# Patient Record
Sex: Female | Born: 1940 | Race: White | Hispanic: No | Marital: Married | Smoking: Never smoker
Health system: Southern US, Community
[De-identification: ages and names within clinical notes are randomized; demographics above are authoritative.]

## PROBLEM LIST (undated history)

## (undated) HISTORY — PX: CATARACT EXTRACTION: SUR2

## (undated) HISTORY — PX: OTHER SURGICAL HISTORY: SHX169

---

## 2012-02-07 DIAGNOSIS — L723 Sebaceous cyst: Secondary | ICD-10-CM | POA: Diagnosis not present

## 2012-02-08 DIAGNOSIS — L570 Actinic keratosis: Secondary | ICD-10-CM | POA: Diagnosis not present

## 2012-02-21 DIAGNOSIS — H04129 Dry eye syndrome of unspecified lacrimal gland: Secondary | ICD-10-CM | POA: Diagnosis not present

## 2012-02-21 DIAGNOSIS — H2510 Age-related nuclear cataract, unspecified eye: Secondary | ICD-10-CM | POA: Diagnosis not present

## 2012-02-21 DIAGNOSIS — H02409 Unspecified ptosis of unspecified eyelid: Secondary | ICD-10-CM | POA: Diagnosis not present

## 2012-11-11 DIAGNOSIS — E785 Hyperlipidemia, unspecified: Secondary | ICD-10-CM | POA: Diagnosis not present

## 2012-11-11 DIAGNOSIS — I8390 Asymptomatic varicose veins of unspecified lower extremity: Secondary | ICD-10-CM | POA: Diagnosis not present

## 2012-11-12 DIAGNOSIS — E785 Hyperlipidemia, unspecified: Secondary | ICD-10-CM | POA: Diagnosis not present

## 2012-11-12 DIAGNOSIS — Z1231 Encounter for screening mammogram for malignant neoplasm of breast: Secondary | ICD-10-CM | POA: Diagnosis not present

## 2012-11-12 DIAGNOSIS — G589 Mononeuropathy, unspecified: Secondary | ICD-10-CM | POA: Diagnosis not present

## 2012-11-14 DIAGNOSIS — R42 Dizziness and giddiness: Secondary | ICD-10-CM | POA: Diagnosis not present

## 2012-11-14 DIAGNOSIS — E039 Hypothyroidism, unspecified: Secondary | ICD-10-CM | POA: Diagnosis not present

## 2013-02-03 DIAGNOSIS — E039 Hypothyroidism, unspecified: Secondary | ICD-10-CM | POA: Diagnosis not present

## 2013-02-06 DIAGNOSIS — E039 Hypothyroidism, unspecified: Secondary | ICD-10-CM | POA: Diagnosis not present

## 2013-09-25 DIAGNOSIS — I6529 Occlusion and stenosis of unspecified carotid artery: Secondary | ICD-10-CM | POA: Diagnosis not present

## 2013-09-25 DIAGNOSIS — N390 Urinary tract infection, site not specified: Secondary | ICD-10-CM | POA: Diagnosis not present

## 2013-09-25 DIAGNOSIS — I658 Occlusion and stenosis of other precerebral arteries: Secondary | ICD-10-CM | POA: Diagnosis not present

## 2013-09-25 DIAGNOSIS — R404 Transient alteration of awareness: Secondary | ICD-10-CM | POA: Diagnosis not present

## 2013-09-25 DIAGNOSIS — I517 Cardiomegaly: Secondary | ICD-10-CM | POA: Diagnosis not present

## 2013-09-25 DIAGNOSIS — I951 Orthostatic hypotension: Secondary | ICD-10-CM | POA: Diagnosis not present

## 2013-09-25 DIAGNOSIS — R61 Generalized hyperhidrosis: Secondary | ICD-10-CM | POA: Diagnosis not present

## 2013-09-25 DIAGNOSIS — R55 Syncope and collapse: Secondary | ICD-10-CM | POA: Diagnosis not present

## 2013-09-25 DIAGNOSIS — E039 Hypothyroidism, unspecified: Secondary | ICD-10-CM | POA: Diagnosis not present

## 2013-09-25 DIAGNOSIS — I959 Hypotension, unspecified: Secondary | ICD-10-CM | POA: Diagnosis not present

## 2013-09-25 DIAGNOSIS — R9431 Abnormal electrocardiogram [ECG] [EKG]: Secondary | ICD-10-CM | POA: Diagnosis not present

## 2013-09-25 DIAGNOSIS — Z23 Encounter for immunization: Secondary | ICD-10-CM | POA: Diagnosis not present

## 2013-09-26 DIAGNOSIS — I369 Nonrheumatic tricuspid valve disorder, unspecified: Secondary | ICD-10-CM | POA: Diagnosis not present

## 2013-09-26 DIAGNOSIS — I059 Rheumatic mitral valve disease, unspecified: Secondary | ICD-10-CM | POA: Diagnosis not present

## 2013-09-26 DIAGNOSIS — R55 Syncope and collapse: Secondary | ICD-10-CM | POA: Diagnosis not present

## 2013-10-08 DIAGNOSIS — R012 Other cardiac sounds: Secondary | ICD-10-CM | POA: Diagnosis not present

## 2013-10-08 DIAGNOSIS — E039 Hypothyroidism, unspecified: Secondary | ICD-10-CM | POA: Diagnosis not present

## 2013-10-08 DIAGNOSIS — R42 Dizziness and giddiness: Secondary | ICD-10-CM | POA: Diagnosis not present

## 2013-10-23 ENCOUNTER — Ambulatory Visit: Payer: Self-pay | Admitting: Cardiology

## 2013-11-03 DIAGNOSIS — J069 Acute upper respiratory infection, unspecified: Secondary | ICD-10-CM | POA: Diagnosis not present

## 2013-11-03 DIAGNOSIS — R002 Palpitations: Secondary | ICD-10-CM | POA: Diagnosis not present

## 2013-11-05 DIAGNOSIS — H02409 Unspecified ptosis of unspecified eyelid: Secondary | ICD-10-CM | POA: Diagnosis not present

## 2013-11-05 DIAGNOSIS — H2510 Age-related nuclear cataract, unspecified eye: Secondary | ICD-10-CM | POA: Diagnosis not present

## 2013-11-05 DIAGNOSIS — H04129 Dry eye syndrome of unspecified lacrimal gland: Secondary | ICD-10-CM | POA: Diagnosis not present

## 2013-11-06 DIAGNOSIS — R002 Palpitations: Secondary | ICD-10-CM | POA: Diagnosis not present

## 2013-11-09 DIAGNOSIS — I10 Essential (primary) hypertension: Secondary | ICD-10-CM | POA: Diagnosis not present

## 2013-11-09 DIAGNOSIS — L570 Actinic keratosis: Secondary | ICD-10-CM | POA: Diagnosis not present

## 2013-11-09 DIAGNOSIS — L819 Disorder of pigmentation, unspecified: Secondary | ICD-10-CM | POA: Diagnosis not present

## 2013-11-10 DIAGNOSIS — R000 Tachycardia, unspecified: Secondary | ICD-10-CM | POA: Diagnosis not present

## 2013-11-13 DIAGNOSIS — R42 Dizziness and giddiness: Secondary | ICD-10-CM | POA: Diagnosis not present

## 2013-11-13 DIAGNOSIS — E039 Hypothyroidism, unspecified: Secondary | ICD-10-CM | POA: Diagnosis not present

## 2013-11-13 DIAGNOSIS — I4949 Other premature depolarization: Secondary | ICD-10-CM | POA: Diagnosis not present

## 2013-11-17 DIAGNOSIS — Z1231 Encounter for screening mammogram for malignant neoplasm of breast: Secondary | ICD-10-CM | POA: Diagnosis not present

## 2013-11-24 DIAGNOSIS — H02409 Unspecified ptosis of unspecified eyelid: Secondary | ICD-10-CM | POA: Diagnosis not present

## 2014-02-09 DIAGNOSIS — H02839 Dermatochalasis of unspecified eye, unspecified eyelid: Secondary | ICD-10-CM | POA: Diagnosis not present

## 2014-02-09 DIAGNOSIS — H5340 Unspecified visual field defects: Secondary | ICD-10-CM | POA: Diagnosis not present

## 2014-02-09 DIAGNOSIS — H02429 Myogenic ptosis of unspecified eyelid: Secondary | ICD-10-CM | POA: Diagnosis not present

## 2014-03-25 DIAGNOSIS — R059 Cough, unspecified: Secondary | ICD-10-CM | POA: Diagnosis not present

## 2014-03-25 DIAGNOSIS — R05 Cough: Secondary | ICD-10-CM | POA: Diagnosis not present

## 2014-03-25 DIAGNOSIS — E039 Hypothyroidism, unspecified: Secondary | ICD-10-CM | POA: Diagnosis not present

## 2014-04-13 DIAGNOSIS — Z01818 Encounter for other preprocedural examination: Secondary | ICD-10-CM | POA: Diagnosis not present

## 2014-04-13 DIAGNOSIS — H02409 Unspecified ptosis of unspecified eyelid: Secondary | ICD-10-CM | POA: Diagnosis not present

## 2014-04-22 DIAGNOSIS — H5340 Unspecified visual field defects: Secondary | ICD-10-CM | POA: Diagnosis not present

## 2014-04-22 DIAGNOSIS — H02409 Unspecified ptosis of unspecified eyelid: Secondary | ICD-10-CM | POA: Diagnosis not present

## 2014-04-22 DIAGNOSIS — Z85828 Personal history of other malignant neoplasm of skin: Secondary | ICD-10-CM | POA: Diagnosis not present

## 2014-04-22 DIAGNOSIS — E039 Hypothyroidism, unspecified: Secondary | ICD-10-CM | POA: Diagnosis not present

## 2014-04-22 DIAGNOSIS — H02429 Myogenic ptosis of unspecified eyelid: Secondary | ICD-10-CM | POA: Diagnosis not present

## 2014-08-05 DIAGNOSIS — Z23 Encounter for immunization: Secondary | ICD-10-CM | POA: Diagnosis not present

## 2014-11-09 DIAGNOSIS — E039 Hypothyroidism, unspecified: Secondary | ICD-10-CM | POA: Diagnosis not present

## 2014-11-09 DIAGNOSIS — E669 Obesity, unspecified: Secondary | ICD-10-CM | POA: Diagnosis not present

## 2014-11-09 DIAGNOSIS — Z23 Encounter for immunization: Secondary | ICD-10-CM | POA: Diagnosis not present

## 2014-11-09 DIAGNOSIS — Z713 Dietary counseling and surveillance: Secondary | ICD-10-CM | POA: Diagnosis not present

## 2014-11-09 DIAGNOSIS — Z6833 Body mass index (BMI) 33.0-33.9, adult: Secondary | ICD-10-CM | POA: Diagnosis not present

## 2014-12-15 ENCOUNTER — Other Ambulatory Visit: Payer: Self-pay

## 2014-12-15 DIAGNOSIS — Z1231 Encounter for screening mammogram for malignant neoplasm of breast: Secondary | ICD-10-CM

## 2014-12-29 ENCOUNTER — Ambulatory Visit
Admission: RE | Admit: 2014-12-29 | Discharge: 2014-12-29 | Disposition: A | Discharge status: Home / Self Care | Payer: Medicare Other | Source: Ambulatory Visit

## 2014-12-29 DIAGNOSIS — Z1231 Encounter for screening mammogram for malignant neoplasm of breast: Secondary | ICD-10-CM | POA: Diagnosis not present

## 2015-02-14 DIAGNOSIS — H2513 Age-related nuclear cataract, bilateral: Secondary | ICD-10-CM | POA: Diagnosis not present

## 2015-02-28 DIAGNOSIS — H25011 Cortical age-related cataract, right eye: Secondary | ICD-10-CM | POA: Diagnosis not present

## 2015-02-28 DIAGNOSIS — H2512 Age-related nuclear cataract, left eye: Secondary | ICD-10-CM | POA: Diagnosis not present

## 2015-02-28 DIAGNOSIS — H02411 Mechanical ptosis of right eyelid: Secondary | ICD-10-CM | POA: Diagnosis not present

## 2015-02-28 DIAGNOSIS — H2511 Age-related nuclear cataract, right eye: Secondary | ICD-10-CM | POA: Diagnosis not present

## 2015-03-10 DIAGNOSIS — E669 Obesity, unspecified: Secondary | ICD-10-CM | POA: Diagnosis not present

## 2015-03-10 DIAGNOSIS — Z0000 Encounter for general adult medical examination without abnormal findings: Secondary | ICD-10-CM | POA: Diagnosis not present

## 2015-03-10 DIAGNOSIS — Z131 Encounter for screening for diabetes mellitus: Secondary | ICD-10-CM | POA: Diagnosis not present

## 2015-03-10 DIAGNOSIS — Z136 Encounter for screening for cardiovascular disorders: Secondary | ICD-10-CM | POA: Diagnosis not present

## 2015-03-10 DIAGNOSIS — Z23 Encounter for immunization: Secondary | ICD-10-CM | POA: Diagnosis not present

## 2015-03-10 DIAGNOSIS — Z780 Asymptomatic menopausal state: Secondary | ICD-10-CM | POA: Diagnosis not present

## 2015-03-10 DIAGNOSIS — E039 Hypothyroidism, unspecified: Secondary | ICD-10-CM | POA: Diagnosis not present

## 2015-03-10 DIAGNOSIS — Z85828 Personal history of other malignant neoplasm of skin: Secondary | ICD-10-CM | POA: Diagnosis not present

## 2015-03-18 DIAGNOSIS — L570 Actinic keratosis: Secondary | ICD-10-CM | POA: Diagnosis not present

## 2015-03-18 DIAGNOSIS — X32XXXD Exposure to sunlight, subsequent encounter: Secondary | ICD-10-CM | POA: Diagnosis not present

## 2015-03-18 DIAGNOSIS — L309 Dermatitis, unspecified: Secondary | ICD-10-CM | POA: Diagnosis not present

## 2015-03-18 DIAGNOSIS — D225 Melanocytic nevi of trunk: Secondary | ICD-10-CM | POA: Diagnosis not present

## 2015-03-24 DIAGNOSIS — H2513 Age-related nuclear cataract, bilateral: Secondary | ICD-10-CM | POA: Diagnosis not present

## 2015-03-24 DIAGNOSIS — H2511 Age-related nuclear cataract, right eye: Secondary | ICD-10-CM | POA: Diagnosis not present

## 2015-03-24 DIAGNOSIS — H25811 Combined forms of age-related cataract, right eye: Secondary | ICD-10-CM | POA: Diagnosis not present

## 2015-03-25 DIAGNOSIS — H2512 Age-related nuclear cataract, left eye: Secondary | ICD-10-CM | POA: Diagnosis not present

## 2015-03-29 DIAGNOSIS — Z780 Asymptomatic menopausal state: Secondary | ICD-10-CM | POA: Diagnosis not present

## 2015-03-29 DIAGNOSIS — E2839 Other primary ovarian failure: Secondary | ICD-10-CM | POA: Diagnosis not present

## 2015-04-14 DIAGNOSIS — H2513 Age-related nuclear cataract, bilateral: Secondary | ICD-10-CM | POA: Diagnosis not present

## 2015-04-14 DIAGNOSIS — H2512 Age-related nuclear cataract, left eye: Secondary | ICD-10-CM | POA: Diagnosis not present

## 2015-04-14 DIAGNOSIS — H25812 Combined forms of age-related cataract, left eye: Secondary | ICD-10-CM | POA: Diagnosis not present

## 2015-08-30 DIAGNOSIS — L570 Actinic keratosis: Secondary | ICD-10-CM | POA: Diagnosis not present

## 2015-08-30 DIAGNOSIS — C44310 Basal cell carcinoma of skin of unspecified parts of face: Secondary | ICD-10-CM | POA: Diagnosis not present

## 2015-08-30 DIAGNOSIS — X32XXXD Exposure to sunlight, subsequent encounter: Secondary | ICD-10-CM | POA: Diagnosis not present

## 2015-10-11 DIAGNOSIS — X32XXXD Exposure to sunlight, subsequent encounter: Secondary | ICD-10-CM | POA: Diagnosis not present

## 2015-10-11 DIAGNOSIS — C4441 Basal cell carcinoma of skin of scalp and neck: Secondary | ICD-10-CM | POA: Diagnosis not present

## 2015-10-11 DIAGNOSIS — L570 Actinic keratosis: Secondary | ICD-10-CM | POA: Diagnosis not present

## 2015-10-11 DIAGNOSIS — Z85828 Personal history of other malignant neoplasm of skin: Secondary | ICD-10-CM | POA: Diagnosis not present

## 2015-10-11 DIAGNOSIS — Z08 Encounter for follow-up examination after completed treatment for malignant neoplasm: Secondary | ICD-10-CM | POA: Diagnosis not present

## 2016-03-14 DIAGNOSIS — E039 Hypothyroidism, unspecified: Secondary | ICD-10-CM | POA: Diagnosis not present

## 2016-03-14 DIAGNOSIS — Z0000 Encounter for general adult medical examination without abnormal findings: Secondary | ICD-10-CM | POA: Diagnosis not present

## 2016-03-14 DIAGNOSIS — Z131 Encounter for screening for diabetes mellitus: Secondary | ICD-10-CM | POA: Diagnosis not present

## 2016-03-14 DIAGNOSIS — E663 Overweight: Secondary | ICD-10-CM | POA: Diagnosis not present

## 2016-08-07 DIAGNOSIS — H16223 Keratoconjunctivitis sicca, not specified as Sjogren's, bilateral: Secondary | ICD-10-CM | POA: Diagnosis not present

## 2016-12-14 ENCOUNTER — Other Ambulatory Visit: Payer: Self-pay | Admitting: Family Medicine

## 2016-12-14 DIAGNOSIS — Z1231 Encounter for screening mammogram for malignant neoplasm of breast: Secondary | ICD-10-CM

## 2016-12-26 ENCOUNTER — Ambulatory Visit
Admission: RE | Admit: 2016-12-26 | Discharge: 2016-12-26 | Disposition: A | Discharge status: Home / Self Care | Payer: Medicare HMO | Source: Ambulatory Visit | Attending: Family Medicine | Admitting: Family Medicine

## 2016-12-26 DIAGNOSIS — Z1231 Encounter for screening mammogram for malignant neoplasm of breast: Secondary | ICD-10-CM | POA: Diagnosis not present

## 2017-01-18 DIAGNOSIS — R0789 Other chest pain: Secondary | ICD-10-CM | POA: Diagnosis not present

## 2017-03-19 DIAGNOSIS — E669 Obesity, unspecified: Secondary | ICD-10-CM | POA: Diagnosis not present

## 2017-03-19 DIAGNOSIS — Z6831 Body mass index (BMI) 31.0-31.9, adult: Secondary | ICD-10-CM | POA: Diagnosis not present

## 2017-03-19 DIAGNOSIS — E039 Hypothyroidism, unspecified: Secondary | ICD-10-CM | POA: Diagnosis not present

## 2017-03-19 DIAGNOSIS — Z131 Encounter for screening for diabetes mellitus: Secondary | ICD-10-CM | POA: Diagnosis not present

## 2017-03-19 DIAGNOSIS — Z0000 Encounter for general adult medical examination without abnormal findings: Secondary | ICD-10-CM | POA: Diagnosis not present

## 2017-03-19 DIAGNOSIS — Z85828 Personal history of other malignant neoplasm of skin: Secondary | ICD-10-CM | POA: Diagnosis not present

## 2017-03-19 DIAGNOSIS — Z1211 Encounter for screening for malignant neoplasm of colon: Secondary | ICD-10-CM | POA: Diagnosis not present

## 2017-03-25 DIAGNOSIS — Z1211 Encounter for screening for malignant neoplasm of colon: Secondary | ICD-10-CM | POA: Diagnosis not present

## 2017-11-04 DIAGNOSIS — H02403 Unspecified ptosis of bilateral eyelids: Secondary | ICD-10-CM | POA: Diagnosis not present

## 2017-11-07 DIAGNOSIS — Z23 Encounter for immunization: Secondary | ICD-10-CM | POA: Diagnosis not present

## 2017-12-10 DIAGNOSIS — H02423 Myogenic ptosis of bilateral eyelids: Secondary | ICD-10-CM | POA: Diagnosis not present

## 2017-12-16 DIAGNOSIS — H02423 Myogenic ptosis of bilateral eyelids: Secondary | ICD-10-CM | POA: Diagnosis not present

## 2018-01-01 DIAGNOSIS — H5789 Other specified disorders of eye and adnexa: Secondary | ICD-10-CM | POA: Diagnosis not present

## 2018-01-01 DIAGNOSIS — H02423 Myogenic ptosis of bilateral eyelids: Secondary | ICD-10-CM | POA: Diagnosis not present

## 2018-01-01 DIAGNOSIS — H02421 Myogenic ptosis of right eyelid: Secondary | ICD-10-CM | POA: Diagnosis not present

## 2018-01-01 DIAGNOSIS — H02422 Myogenic ptosis of left eyelid: Secondary | ICD-10-CM | POA: Diagnosis not present

## 2018-04-01 DIAGNOSIS — Z1389 Encounter for screening for other disorder: Secondary | ICD-10-CM | POA: Diagnosis not present

## 2018-04-01 DIAGNOSIS — Z85828 Personal history of other malignant neoplasm of skin: Secondary | ICD-10-CM | POA: Diagnosis not present

## 2018-04-01 DIAGNOSIS — Z1211 Encounter for screening for malignant neoplasm of colon: Secondary | ICD-10-CM | POA: Diagnosis not present

## 2018-04-01 DIAGNOSIS — R42 Dizziness and giddiness: Secondary | ICD-10-CM | POA: Diagnosis not present

## 2018-04-01 DIAGNOSIS — Z0000 Encounter for general adult medical examination without abnormal findings: Secondary | ICD-10-CM | POA: Diagnosis not present

## 2018-04-01 DIAGNOSIS — Z6831 Body mass index (BMI) 31.0-31.9, adult: Secondary | ICD-10-CM | POA: Diagnosis not present

## 2018-04-01 DIAGNOSIS — E039 Hypothyroidism, unspecified: Secondary | ICD-10-CM | POA: Diagnosis not present

## 2018-04-01 DIAGNOSIS — E669 Obesity, unspecified: Secondary | ICD-10-CM | POA: Diagnosis not present

## 2018-05-23 ENCOUNTER — Other Ambulatory Visit: Payer: Self-pay | Admitting: Family Medicine

## 2018-05-23 DIAGNOSIS — Z1231 Encounter for screening mammogram for malignant neoplasm of breast: Secondary | ICD-10-CM

## 2018-06-16 ENCOUNTER — Ambulatory Visit: Payer: Medicare HMO

## 2018-07-07 ENCOUNTER — Ambulatory Visit
Admission: RE | Admit: 2018-07-07 | Discharge: 2018-07-07 | Disposition: A | Discharge status: Home / Self Care | Payer: Medicare HMO | Source: Ambulatory Visit | Attending: Family Medicine | Admitting: Family Medicine

## 2018-07-07 DIAGNOSIS — Z1231 Encounter for screening mammogram for malignant neoplasm of breast: Secondary | ICD-10-CM | POA: Diagnosis not present

## 2018-10-01 DIAGNOSIS — Z23 Encounter for immunization: Secondary | ICD-10-CM | POA: Diagnosis not present

## 2018-10-21 DIAGNOSIS — H5212 Myopia, left eye: Secondary | ICD-10-CM | POA: Diagnosis not present

## 2018-10-21 DIAGNOSIS — Z0100 Encounter for examination of eyes and vision without abnormal findings: Secondary | ICD-10-CM | POA: Diagnosis not present

## 2019-05-05 DIAGNOSIS — Z0000 Encounter for general adult medical examination without abnormal findings: Secondary | ICD-10-CM | POA: Diagnosis not present

## 2019-05-05 DIAGNOSIS — Z136 Encounter for screening for cardiovascular disorders: Secondary | ICD-10-CM | POA: Diagnosis not present

## 2019-05-05 DIAGNOSIS — Z131 Encounter for screening for diabetes mellitus: Secondary | ICD-10-CM | POA: Diagnosis not present

## 2019-05-05 DIAGNOSIS — E039 Hypothyroidism, unspecified: Secondary | ICD-10-CM | POA: Diagnosis not present

## 2019-05-11 DIAGNOSIS — Z85828 Personal history of other malignant neoplasm of skin: Secondary | ICD-10-CM | POA: Diagnosis not present

## 2019-05-11 DIAGNOSIS — E669 Obesity, unspecified: Secondary | ICD-10-CM | POA: Diagnosis not present

## 2019-05-11 DIAGNOSIS — R899 Unspecified abnormal finding in specimens from other organs, systems and tissues: Secondary | ICD-10-CM | POA: Diagnosis not present

## 2019-05-11 DIAGNOSIS — E7800 Pure hypercholesterolemia, unspecified: Secondary | ICD-10-CM | POA: Diagnosis not present

## 2019-05-20 DIAGNOSIS — Z85828 Personal history of other malignant neoplasm of skin: Secondary | ICD-10-CM | POA: Diagnosis not present

## 2019-05-20 DIAGNOSIS — L570 Actinic keratosis: Secondary | ICD-10-CM | POA: Diagnosis not present

## 2019-05-20 DIAGNOSIS — X32XXXA Exposure to sunlight, initial encounter: Secondary | ICD-10-CM | POA: Diagnosis not present

## 2019-05-20 DIAGNOSIS — L308 Other specified dermatitis: Secondary | ICD-10-CM | POA: Diagnosis not present

## 2019-05-20 DIAGNOSIS — Z08 Encounter for follow-up examination after completed treatment for malignant neoplasm: Secondary | ICD-10-CM | POA: Diagnosis not present

## 2019-08-18 DIAGNOSIS — H04123 Dry eye syndrome of bilateral lacrimal glands: Secondary | ICD-10-CM | POA: Diagnosis not present

## 2019-09-17 DIAGNOSIS — H02423 Myogenic ptosis of bilateral eyelids: Secondary | ICD-10-CM | POA: Diagnosis not present

## 2019-09-17 DIAGNOSIS — H02834 Dermatochalasis of left upper eyelid: Secondary | ICD-10-CM | POA: Diagnosis not present

## 2019-09-17 DIAGNOSIS — H04129 Dry eye syndrome of unspecified lacrimal gland: Secondary | ICD-10-CM | POA: Diagnosis not present

## 2019-09-17 DIAGNOSIS — H02835 Dermatochalasis of left lower eyelid: Secondary | ICD-10-CM | POA: Diagnosis not present

## 2019-09-17 DIAGNOSIS — H53483 Generalized contraction of visual field, bilateral: Secondary | ICD-10-CM | POA: Diagnosis not present

## 2019-09-17 DIAGNOSIS — H02413 Mechanical ptosis of bilateral eyelids: Secondary | ICD-10-CM | POA: Diagnosis not present

## 2019-09-17 DIAGNOSIS — H02831 Dermatochalasis of right upper eyelid: Secondary | ICD-10-CM | POA: Diagnosis not present

## 2019-09-17 DIAGNOSIS — H02832 Dermatochalasis of right lower eyelid: Secondary | ICD-10-CM | POA: Diagnosis not present

## 2020-03-28 DIAGNOSIS — H5212 Myopia, left eye: Secondary | ICD-10-CM | POA: Diagnosis not present

## 2020-05-05 DIAGNOSIS — Z8639 Personal history of other endocrine, nutritional and metabolic disease: Secondary | ICD-10-CM | POA: Diagnosis not present

## 2020-05-05 DIAGNOSIS — Z0000 Encounter for general adult medical examination without abnormal findings: Secondary | ICD-10-CM | POA: Diagnosis not present

## 2020-05-05 DIAGNOSIS — Z6831 Body mass index (BMI) 31.0-31.9, adult: Secondary | ICD-10-CM | POA: Diagnosis not present

## 2020-05-05 DIAGNOSIS — E785 Hyperlipidemia, unspecified: Secondary | ICD-10-CM | POA: Diagnosis not present

## 2020-05-10 DIAGNOSIS — Z0000 Encounter for general adult medical examination without abnormal findings: Secondary | ICD-10-CM | POA: Diagnosis not present

## 2020-06-21 DIAGNOSIS — Z0100 Encounter for examination of eyes and vision without abnormal findings: Secondary | ICD-10-CM | POA: Diagnosis not present

## 2020-08-23 ENCOUNTER — Other Ambulatory Visit: Payer: Medicare HMO

## 2020-08-23 DIAGNOSIS — Z20822 Contact with and (suspected) exposure to covid-19: Secondary | ICD-10-CM

## 2020-08-25 LAB — SARS-COV-2, NAA 2 DAY TAT

## 2020-08-25 LAB — NOVEL CORONAVIRUS, NAA: SARS-CoV-2, NAA: NOT DETECTED

## 2021-01-04 DIAGNOSIS — H01111 Allergic dermatitis of right upper eyelid: Secondary | ICD-10-CM | POA: Diagnosis not present

## 2021-02-13 DIAGNOSIS — L249 Irritant contact dermatitis, unspecified cause: Secondary | ICD-10-CM | POA: Diagnosis not present

## 2021-02-21 DIAGNOSIS — D3121 Benign neoplasm of right retina: Secondary | ICD-10-CM | POA: Diagnosis not present

## 2021-02-21 DIAGNOSIS — H524 Presbyopia: Secondary | ICD-10-CM | POA: Diagnosis not present

## 2021-02-21 DIAGNOSIS — H5340 Unspecified visual field defects: Secondary | ICD-10-CM | POA: Diagnosis not present

## 2021-02-21 DIAGNOSIS — D3122 Benign neoplasm of left retina: Secondary | ICD-10-CM | POA: Diagnosis not present

## 2021-02-28 DIAGNOSIS — L258 Unspecified contact dermatitis due to other agents: Secondary | ICD-10-CM | POA: Diagnosis not present

## 2021-03-02 DIAGNOSIS — H5340 Unspecified visual field defects: Secondary | ICD-10-CM | POA: Diagnosis not present

## 2021-03-16 ENCOUNTER — Ambulatory Visit: Payer: Medicare HMO | Attending: Internal Medicine

## 2021-03-16 ENCOUNTER — Other Ambulatory Visit: Payer: Self-pay

## 2021-03-16 DIAGNOSIS — Z23 Encounter for immunization: Secondary | ICD-10-CM

## 2021-03-16 NOTE — Progress Notes (Signed)
   Covid-19 Vaccination Clinic  Name:  Summer Wood    MRN: [id number] DOB: [birth year]  03/16/2021  Summer Wood was observed post Covid-19 immunization for 15 minutes without incident. She was provided with Vaccine Information Sheet and instruction to access the V-Safe system.   Summer Wood was instructed to call 911 with any severe reactions post vaccine: Marland Kitchen Difficulty breathing  . Swelling of face and throat  . A fast heartbeat  . A bad rash all over body  . Dizziness and weakness   Immunizations Administered    Name Date Dose VIS Date Route   PFIZER Comrnaty(Gray TOP) Covid-19 Vaccine 03/16/2021  1:52 PM 0.3 mL 11/10/2020 Intramuscular   Manufacturer: [place]   Lot: [id number]   [place]: [phone]

## 2021-03-20 ENCOUNTER — Other Ambulatory Visit (HOSPITAL_BASED_OUTPATIENT_CLINIC_OR_DEPARTMENT_OTHER): Payer: Self-pay

## 2021-03-20 MED ORDER — COVID-19 MRNA VACCINE (PFIZER) 30 MCG/0.3ML IM SUSP
INTRAMUSCULAR | 0 refills | Status: DC
  Filled 2021-03-20: qty 0.3, 1d supply, fill #0

## 2021-05-08 DIAGNOSIS — Z0000 Encounter for general adult medical examination without abnormal findings: Secondary | ICD-10-CM | POA: Diagnosis not present

## 2021-05-08 DIAGNOSIS — Z1211 Encounter for screening for malignant neoplasm of colon: Secondary | ICD-10-CM | POA: Diagnosis not present

## 2021-05-08 DIAGNOSIS — Z1389 Encounter for screening for other disorder: Secondary | ICD-10-CM | POA: Diagnosis not present

## 2021-05-09 DIAGNOSIS — Z1211 Encounter for screening for malignant neoplasm of colon: Secondary | ICD-10-CM | POA: Diagnosis not present

## 2021-05-12 ENCOUNTER — Other Ambulatory Visit: Payer: Self-pay | Admitting: Family Medicine

## 2021-05-12 DIAGNOSIS — Z1231 Encounter for screening mammogram for malignant neoplasm of breast: Secondary | ICD-10-CM

## 2021-06-13 DIAGNOSIS — E785 Hyperlipidemia, unspecified: Secondary | ICD-10-CM | POA: Diagnosis not present

## 2021-06-13 DIAGNOSIS — E669 Obesity, unspecified: Secondary | ICD-10-CM | POA: Diagnosis not present

## 2021-06-13 DIAGNOSIS — Z6832 Body mass index (BMI) 32.0-32.9, adult: Secondary | ICD-10-CM | POA: Diagnosis not present

## 2021-06-13 DIAGNOSIS — Z8639 Personal history of other endocrine, nutritional and metabolic disease: Secondary | ICD-10-CM | POA: Diagnosis not present

## 2021-06-13 DIAGNOSIS — Z79899 Other long term (current) drug therapy: Secondary | ICD-10-CM | POA: Diagnosis not present

## 2021-06-27 ENCOUNTER — Other Ambulatory Visit: Payer: Self-pay | Admitting: Family Medicine

## 2021-06-27 DIAGNOSIS — E785 Hyperlipidemia, unspecified: Secondary | ICD-10-CM

## 2021-07-06 ENCOUNTER — Other Ambulatory Visit: Payer: Self-pay

## 2021-07-06 ENCOUNTER — Ambulatory Visit
Admission: RE | Admit: 2021-07-06 | Discharge: 2021-07-06 | Disposition: A | Discharge status: Home / Self Care | Payer: Medicare HMO | Source: Ambulatory Visit | Attending: Family Medicine | Admitting: Family Medicine

## 2021-07-06 DIAGNOSIS — Z1231 Encounter for screening mammogram for malignant neoplasm of breast: Secondary | ICD-10-CM | POA: Diagnosis not present

## 2021-07-18 ENCOUNTER — Other Ambulatory Visit: Payer: Self-pay

## 2021-07-18 ENCOUNTER — Ambulatory Visit
Admission: RE | Admit: 2021-07-18 | Discharge: 2021-07-18 | Disposition: A | Discharge status: Home / Self Care | Payer: No Typology Code available for payment source | Source: Ambulatory Visit | Attending: Family Medicine | Admitting: Family Medicine

## 2021-07-18 DIAGNOSIS — E785 Hyperlipidemia, unspecified: Secondary | ICD-10-CM

## 2021-07-21 ENCOUNTER — Other Ambulatory Visit: Payer: Medicare HMO

## 2021-10-03 DIAGNOSIS — Z23 Encounter for immunization: Secondary | ICD-10-CM | POA: Diagnosis not present

## 2021-10-03 DIAGNOSIS — R42 Dizziness and giddiness: Secondary | ICD-10-CM | POA: Diagnosis not present

## 2021-10-18 IMAGING — CT CT CARDIAC CORONARY ARTERY CALCIUM SCORE
3 series · 14 of 20 positions shown, 16 images · non-contrast
Comparison: None.

CLINICAL DATA: Hyperlipidemia

EXAM:
CT CARDIAC CORONARY ARTERY CALCIUM SCORE
TECHNIQUE: Non-contrast imaging through the heart was performed using
prospective ECG gating. Image post processing was performed on an
independent workstation, allowing for quantitative analysis of the
heart and coronary arteries. Note that this exam targets the heart
and the chest was not imaged in its entirety.

[Series 2: calcium scoring 2.00 qr36 bestdiast 70% hrt calciu · axial · 0.41mm/px · z∈[+1663,+1747]mm · 4 of 70 slices shown]
[im 14/70  vessel]
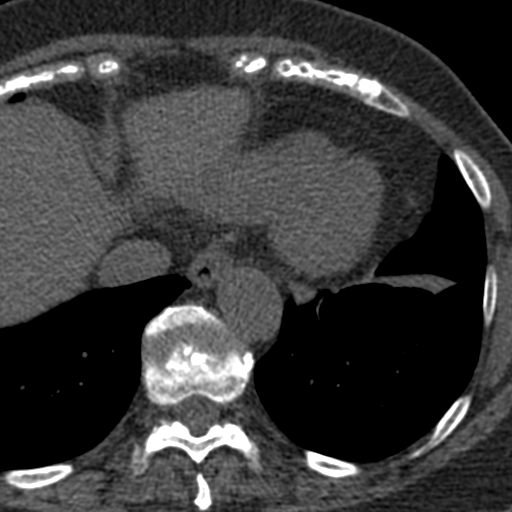
[im 28/70  vessel]
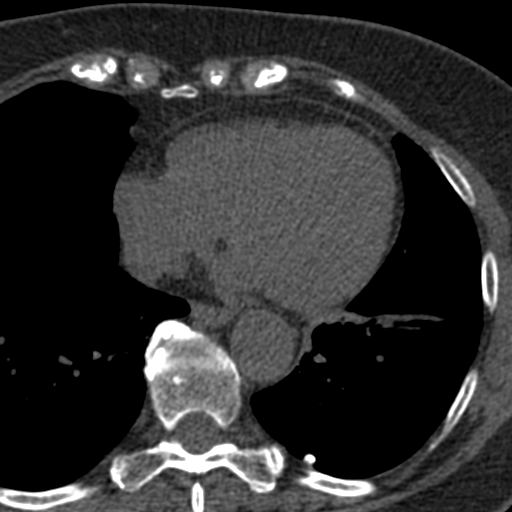
[im 42/70  vessel]
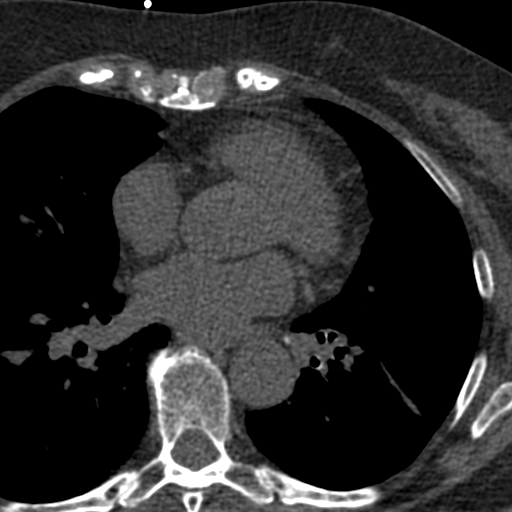
[im 56/70  vessel]
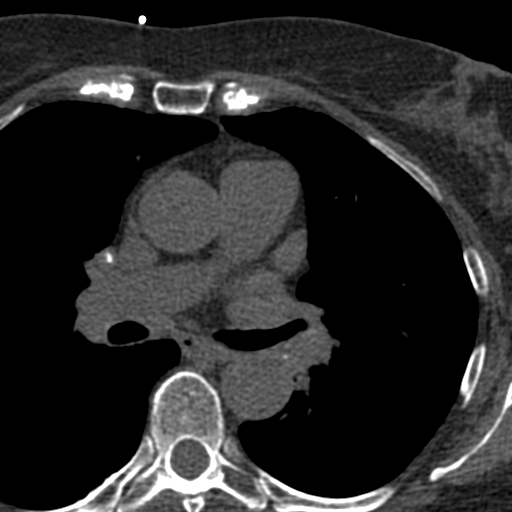

[Series 3: calcium scoring 2.00 br40 bestdiast 70% axial · axial · 0.54mm/px · z∈[+1659,+1751]mm · 5 of 70 slices shown, 7 images]
[im 12/70  vessel]
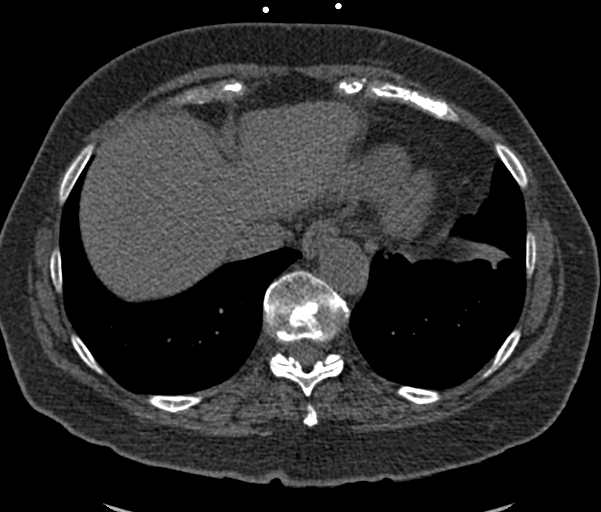
[im 12/70  lung]
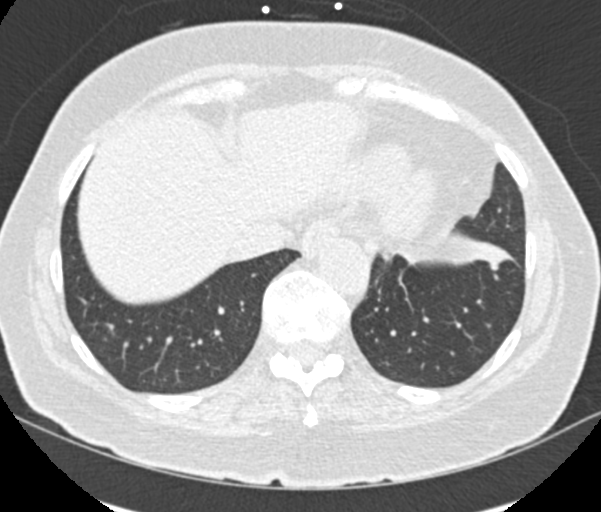
[im 24/70  vessel]
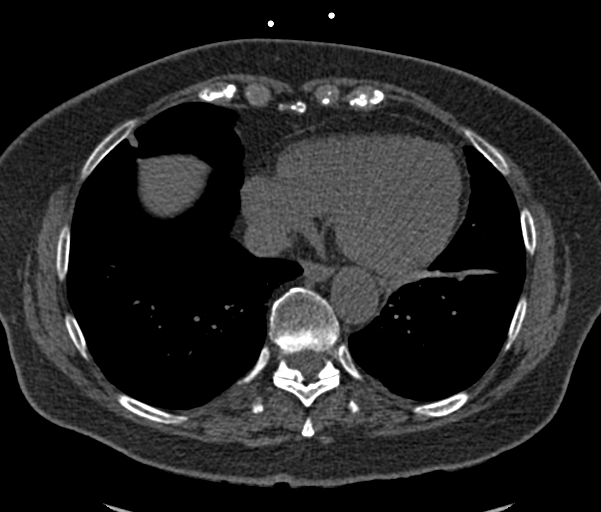
[im 35/70  vessel]
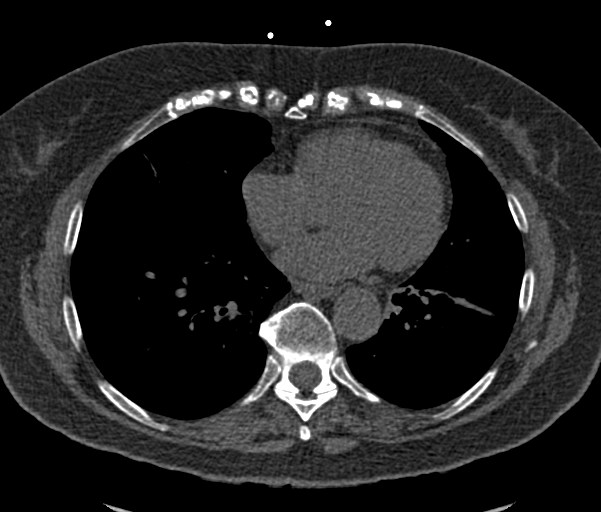
[im 47/70  vessel]
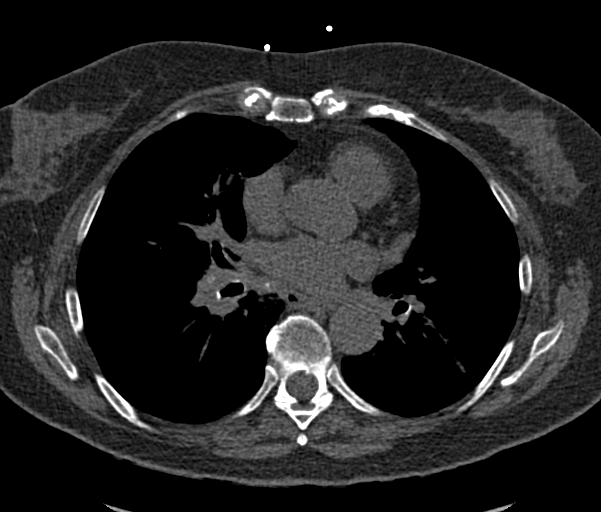
[im 58/70  vessel]
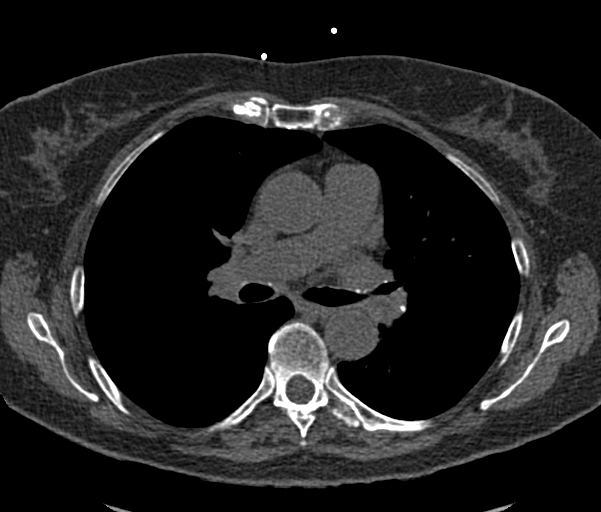
[im 58/70  lung]
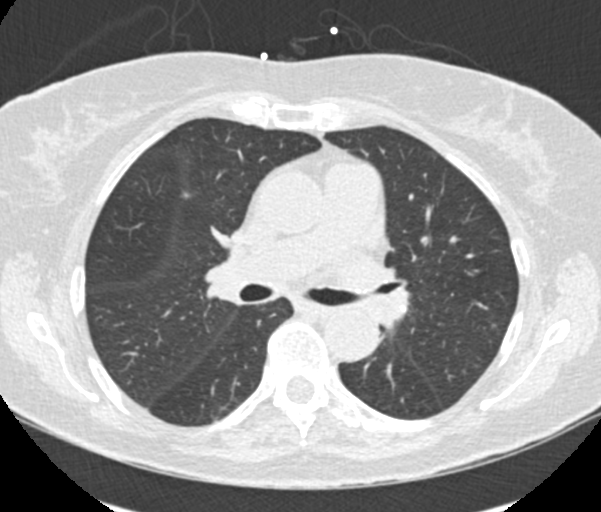

[Series 9: calcium scoring 2.00 br60 bestdiast 70% lungs · axial · 0.54mm/px · z∈[+1659,+1751]mm · 5 of 70 slices shown]
[im 12/70  vessel]
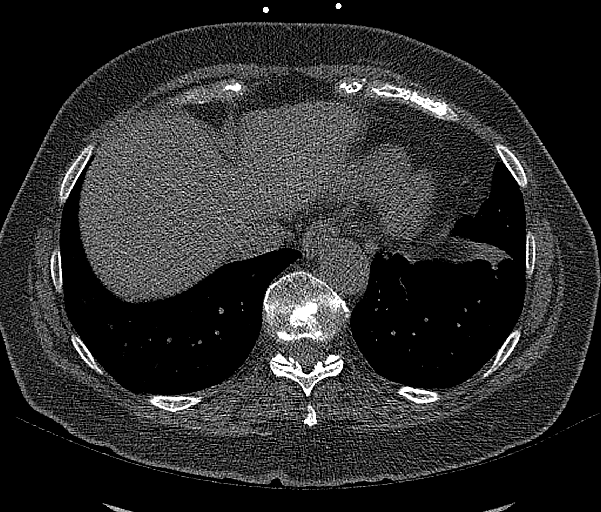
[im 24/70  vessel]
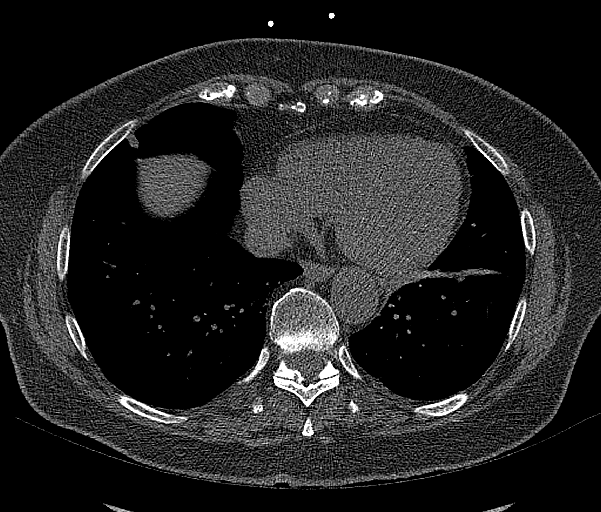
[im 35/70  vessel]
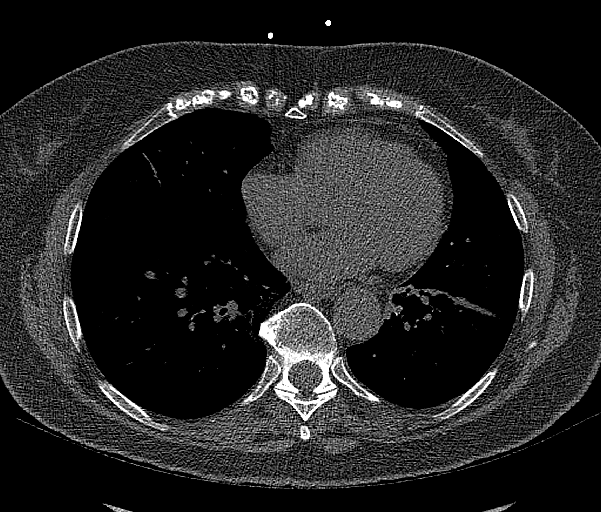
[im 47/70  vessel]
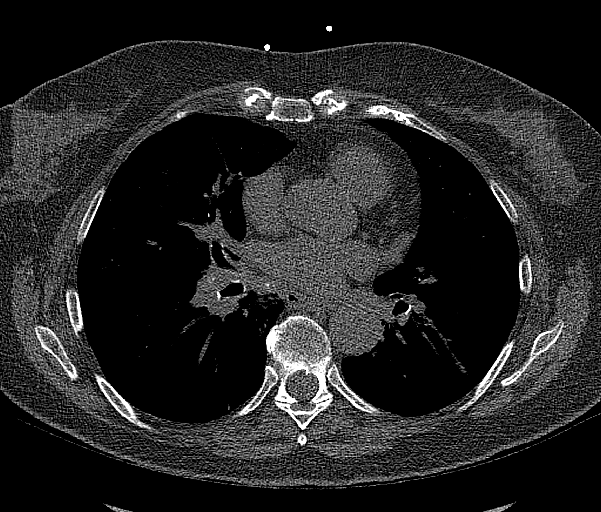
[im 58/70  vessel]
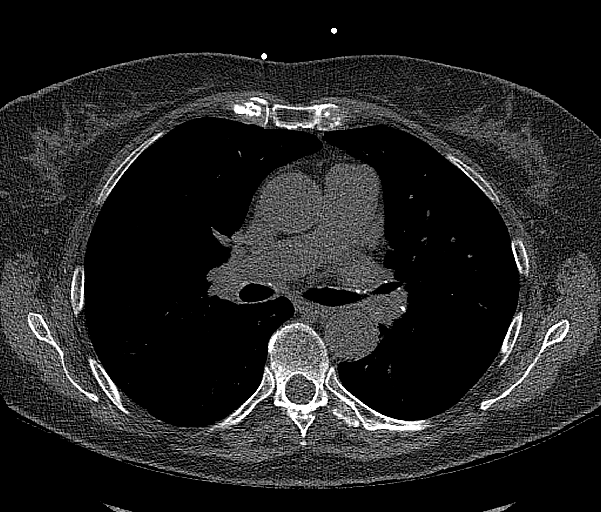

[14 of 20 positions shown; findings below may reference images not displayed]

FINDINGS: CORONARY CALCIUM SCORES:

Left Main: 0

LAD: 0

LCx: 0

RCA: 0

Total Agatston Score: 0

[HOSPITAL] percentile: 0

AORTA MEASUREMENTS:

Ascending Aorta: 34 mm

Descending Aorta: 26 mm

OTHER FINDINGS:

Heart is normal size. Aorta normal caliber. Calcified bilateral
hilar and mediastinal lymph nodes. Noncalcified right middle lobe
nodule measures 5 mm on image 28. Calcified granuloma in the lingula
and left lower lobe. Calcified granuloma in the right lower lobe.
Areas of scarring in the lung bases. No effusions. Imaging into the
upper abdomen demonstrates no acute findings. Chest wall soft
tissues are unremarkable. No acute bony abnormality.
IMPRESSION: No visible coronary artery calcifications. Total coronary calcium
score of 0.

Old granulomatous disease.

5 mm calcified nodule in the right middle lobe. No follow-up needed
if patient is low-risk. Non-contrast chest CT can be considered in
12 months if patient is high-risk. This recommendation follows the
consensus statement: Guidelines for Management of Incidental
Pulmonary Nodules Detected on CT Images: From the [HOSPITAL]

## 2021-11-03 ENCOUNTER — Ambulatory Visit: Payer: Medicare HMO | Admitting: Neurology

## 2021-11-03 ENCOUNTER — Encounter: Payer: Self-pay | Admitting: Neurology

## 2021-11-03 VITALS — BP 155/81 | HR 76 | Ht 65.5 in | Wt 196.0 lb

## 2021-11-03 DIAGNOSIS — I951 Orthostatic hypotension: Secondary | ICD-10-CM

## 2021-11-03 DIAGNOSIS — R42 Dizziness and giddiness: Secondary | ICD-10-CM

## 2021-11-03 NOTE — Patient Instructions (Signed)
MRI Brain without contrast to rule out central cause of dizziness. I will contact you to go over results.  Recommend compression stocks for orthostatic hypotension  Follow up with your primary doctor  Return if worse

## 2021-11-03 NOTE — Progress Notes (Signed)
[facility]  PATIENT: Summer Wood DOB: 11-07-1941  REQUESTING CLINICIAN: Kathyrn Lass, MD HISTORY FROM: Patient and husband  REASON FOR VISIT: Ongoing dizziness    HISTORICAL  CHIEF COMPLAINT:  Chief Complaint  Patient presents with   New Patient (Initial Visit)    Rm 12, alone. Paper referral from [place] at [facility] for dizziness. Pt reports constant dizziness. Pt reports its something shes always had. Driving long distance and getting off will be dizzy. Has gotten to the point she can no longer drive. Pt reports dizziness when getting up to walk and walking. High ceiling and bright lights are other triggers.    Dizziness    Laying 160/86 HR79 Sitting 136/82 HR80 dizzy Standing 141/83 HR 84 dizziness getting better Standing(2min) 133/84 HR80 slightly dizzy    HISTORY OF PRESENT ILLNESS:  This is a 80 year old woman with past medical history of hypothyroidism, currently not on medication who is presenting with complaint of ongoing dizziness.  Patient reported history of dizziness mostly throughout her life.  She thought her dizziness was secondary to her eyesight, she had her eyelid lifted, had a cataract surgery, new glasses but still having dizziness.  Dizziness described as being lightheaded, always happen with changing positions, from sitting to standing, and from getting up from lying down.  Denies any room spinning sensation.  Patient also mentioned that she will have lightheadedness, dizziness/motion sickness when riding in the car, due to that she has not driven in 4 years.  She does not use any assistive device and denies any fall.  Recently she had purchased a scooter for ambulation.  She has never been worked up for her ongoing dizziness.    OTHER MEDICAL CONDITIONS: None, was taking synthroid for thyroid but discontinue it   REVIEW OF SYSTEMS: Full 14 system review of systems performed and negative with exception of: as noted in the  HPI  ALLERGIES: Not on File  HOME MEDICATIONS: Outpatient Medications Prior to Visit  Medication Sig Dispense Refill   cycloSPORINE (RESTASIS) 0.05 % ophthalmic emulsion Place 1 drop into both eyes 2 (two) times daily.     COVID-19 mRNA vaccine, Pfizer, 30 MCG/0.3ML injection Inject into the muscle. 0.3 mL 0   No facility-administered medications prior to visit.    PAST MEDICAL HISTORY: History reviewed. No pertinent past medical history.  PAST SURGICAL HISTORY: Past Surgical History:  Procedure Laterality Date   CATARACT EXTRACTION     eye lift      FAMILY HISTORY: Family History  Problem Relation Age of Onset   Schizophrenia Mother    Varicose Veins Mother    Diabetes Father     SOCIAL HISTORY: Social History   Socioeconomic History   Marital status: Married    Spouse name: Fritz Pickerel   Number of children: 3   Years of education: Not on file   Highest education level: Associate degree: occupational, Hotel manager, or vocational program  Occupational History   Not on file  Tobacco Use   Smoking status: Never   Smokeless tobacco: Never  Substance and Sexual Activity   Alcohol use: Never   Drug use: Never   Sexual activity: Not on file  Other Topics Concern   Not on file  Social History Narrative   Lives with husband   Right handed   Caffeine: 2 cups of coffee a day   Social Determinants of Health   Financial Resource Strain: Not on file  Food Insecurity: Not on file  Transportation Needs: Not  on file  Physical Activity: Not on file  Stress: Not on file  Social Connections: Not on file  Intimate Partner Violence: Not on file    PHYSICAL EXAM  GENERAL EXAM/CONSTITUTIONAL: Vitals:  Vitals:   11/03/21 1104  BP: (!) 155/81  Pulse: 76  Weight: 196 lb (88.9 kg)  Height: 5' 5.5" (1.664 m)   Body mass index is 32.12 kg/m. Wt Readings from Last 3 Encounters:  11/03/21 196 lb (88.9 kg)    Orthostatic vitals. Laying 160/86 HR79  Sitting 136/82 HR80  dizzy  Standing 141/83 HR 84 dizziness getting better  Standing(48min) 133/84 HR80 slightly dizzy   Patient is in no distress; well developed, nourished and groomed; neck is supple  CARDIOVASCULAR: Examination of carotid arteries is normal; no carotid bruits Regular rate and rhythm, no murmurs Examination of peripheral vascular system by observation and palpation is normal  EYES: Pupils round and reactive to light, Visual fields full to confrontation, Extraocular movements intacts,   MUSCULOSKELETAL: Gait, strength, tone, movements noted in Neurologic exam below  NEUROLOGIC: MENTAL STATUS:  No flowsheet data found. awake, alert, oriented to person, place and time recent and remote memory intact normal attention and concentration language fluent, comprehension intact, naming intact fund of knowledge appropriate  CRANIAL NERVE:  2nd, 3rd, 4th, 6th - pupils equal and reactive to light, visual fields full to confrontation, extraocular muscles intact, no nystagmus 5th - facial sensation symmetric 7th - facial strength symmetric 8th - hearing intact 9th - palate elevates symmetrically, uvula midline 11th - shoulder shrug symmetric 12th - tongue protrusion midline  MOTOR:  normal bulk and tone, full strength in the BUE, BLE  SENSORY:  normal and symmetric to light touch, pinprick, temperature, vibration  COORDINATION:  finger-nose-finger, fine finger movements normal  REFLEXES:  deep tendon reflexes present and symmetric  GAIT/STATION:  normal   DIAGNOSTIC DATA (LABS, IMAGING, TESTING) - I reviewed patient records, labs, notes, testing and imaging myself where available.  No results found for: WBC, HGB, HCT, MCV, PLT No results found for: NA, K, CL, CO2, GLUCOSE, BUN, CREATININE, CALCIUM, PROT, ALBUMIN, AST, ALT, ALKPHOS, BILITOT, GFRNONAA, GFRAA No results found for: CHOL, HDL, LDLCALC, LDLDIRECT, TRIG, CHOLHDL No results found for: HGBA1C No results found for:  VITAMINB12 No results found for: TSH    ASSESSMENT AND PLAN  80 y.o. year old female with reported history of hypothyroidism, currently not on medications who is presenting with ongoing dizziness mostly throughout her adult life.  Dizziness described as lightheaded mostly when going from a laying down to a sitting position or from sitting to standing position, she denies any dizziness while sitting down, denies any dizziness while laying down.  However she does report dizziness with riding in a car, due to that she has not driven in 4 years. We checked her orthostatic vitals and it was positive for orthostatic hypotension, I will recommend compressive stockings and further management by primary care.  For the ongoing dizziness I will order a MRI without contrast, I will contact the patient to go over the result and if normal, meaning no central etiology of her dizziness I will refer her to ENT.    1. Dizziness   2. Orthostatic hypotension     PLAN: MRI Brain without contrast to rule out central cause of dizziness. I will contact you to go over results.  Recommend compression stocks for orthostatic hypotension and further management by primary care Follow up with your primary doctor  Return if worse  Orders Placed This Encounter  Procedures   MR BRAIN WO CONTRAST    No orders of the defined types were placed in this encounter.   Return if symptoms worsen or fail to improve.    Alric Ran, MD 11/03/2021, 3:14 PM  [facility] [address], [place], [place] [address] [phone]

## 2021-11-07 ENCOUNTER — Telehealth: Payer: Self-pay | Admitting: Neurology

## 2021-11-07 NOTE — Telephone Encounter (Signed)
Mcarthur Rossetti Josem Kaufmann: [id number] (exp. 11/07/21 to 12/07/21) order sent to GI. They will reach out to the patient to schedule.

## 2021-11-11 ENCOUNTER — Ambulatory Visit
Admission: RE | Admit: 2021-11-11 | Discharge: 2021-11-11 | Disposition: A | Discharge status: Home / Self Care | Payer: Medicare HMO | Source: Ambulatory Visit | Attending: Neurology | Admitting: Neurology

## 2021-11-11 ENCOUNTER — Other Ambulatory Visit: Payer: Self-pay

## 2021-11-11 DIAGNOSIS — R42 Dizziness and giddiness: Secondary | ICD-10-CM | POA: Diagnosis not present

## 2021-12-27 DIAGNOSIS — R059 Cough, unspecified: Secondary | ICD-10-CM | POA: Diagnosis not present

## 2021-12-27 DIAGNOSIS — R0981 Nasal congestion: Secondary | ICD-10-CM | POA: Diagnosis not present

## 2021-12-27 DIAGNOSIS — U071 COVID-19: Secondary | ICD-10-CM | POA: Diagnosis not present

## 2021-12-27 DIAGNOSIS — E669 Obesity, unspecified: Secondary | ICD-10-CM | POA: Diagnosis not present

## 2022-01-10 DIAGNOSIS — I951 Orthostatic hypotension: Secondary | ICD-10-CM | POA: Diagnosis not present

## 2022-01-10 DIAGNOSIS — H9313 Tinnitus, bilateral: Secondary | ICD-10-CM | POA: Diagnosis not present

## 2022-01-11 DIAGNOSIS — R42 Dizziness and giddiness: Secondary | ICD-10-CM | POA: Diagnosis not present

## 2022-01-11 DIAGNOSIS — H903 Sensorineural hearing loss, bilateral: Secondary | ICD-10-CM | POA: Diagnosis not present

## 2022-01-31 DIAGNOSIS — D1801 Hemangioma of skin and subcutaneous tissue: Secondary | ICD-10-CM | POA: Diagnosis not present

## 2022-01-31 DIAGNOSIS — L821 Other seborrheic keratosis: Secondary | ICD-10-CM | POA: Diagnosis not present

## 2022-01-31 DIAGNOSIS — L708 Other acne: Secondary | ICD-10-CM | POA: Diagnosis not present

## 2022-01-31 DIAGNOSIS — L820 Inflamed seborrheic keratosis: Secondary | ICD-10-CM | POA: Diagnosis not present

## 2022-02-26 DIAGNOSIS — H524 Presbyopia: Secondary | ICD-10-CM | POA: Diagnosis not present

## 2022-02-26 DIAGNOSIS — D3121 Benign neoplasm of right retina: Secondary | ICD-10-CM | POA: Diagnosis not present

## 2022-03-21 DIAGNOSIS — H02403 Unspecified ptosis of bilateral eyelids: Secondary | ICD-10-CM | POA: Diagnosis not present

## 2022-04-17 DIAGNOSIS — H02413 Mechanical ptosis of bilateral eyelids: Secondary | ICD-10-CM | POA: Diagnosis not present

## 2022-04-17 DIAGNOSIS — H02423 Myogenic ptosis of bilateral eyelids: Secondary | ICD-10-CM | POA: Diagnosis not present

## 2022-04-17 DIAGNOSIS — H04129 Dry eye syndrome of unspecified lacrimal gland: Secondary | ICD-10-CM | POA: Diagnosis not present

## 2022-04-17 DIAGNOSIS — H0279 Other degenerative disorders of eyelid and periocular area: Secondary | ICD-10-CM | POA: Diagnosis not present

## 2022-04-17 DIAGNOSIS — H02831 Dermatochalasis of right upper eyelid: Secondary | ICD-10-CM | POA: Diagnosis not present

## 2022-05-11 DIAGNOSIS — R42 Dizziness and giddiness: Secondary | ICD-10-CM | POA: Diagnosis not present

## 2022-05-11 DIAGNOSIS — H02403 Unspecified ptosis of bilateral eyelids: Secondary | ICD-10-CM | POA: Diagnosis not present

## 2022-05-11 DIAGNOSIS — Z0000 Encounter for general adult medical examination without abnormal findings: Secondary | ICD-10-CM | POA: Diagnosis not present

## 2022-05-11 DIAGNOSIS — Z1389 Encounter for screening for other disorder: Secondary | ICD-10-CM | POA: Diagnosis not present

## 2022-05-11 DIAGNOSIS — Z6830 Body mass index (BMI) 30.0-30.9, adult: Secondary | ICD-10-CM | POA: Diagnosis not present

## 2022-05-11 DIAGNOSIS — E669 Obesity, unspecified: Secondary | ICD-10-CM | POA: Diagnosis not present

## 2022-05-11 DIAGNOSIS — Z85828 Personal history of other malignant neoplasm of skin: Secondary | ICD-10-CM | POA: Diagnosis not present

## 2022-05-11 DIAGNOSIS — Z1211 Encounter for screening for malignant neoplasm of colon: Secondary | ICD-10-CM | POA: Diagnosis not present

## 2022-05-15 DIAGNOSIS — Z1211 Encounter for screening for malignant neoplasm of colon: Secondary | ICD-10-CM | POA: Diagnosis not present

## 2022-05-17 ENCOUNTER — Other Ambulatory Visit: Payer: Self-pay | Admitting: Family Medicine

## 2022-05-17 DIAGNOSIS — E2839 Other primary ovarian failure: Secondary | ICD-10-CM

## 2022-05-17 DIAGNOSIS — H4943 Progressive external ophthalmoplegia, bilateral: Secondary | ICD-10-CM | POA: Diagnosis not present

## 2022-05-17 DIAGNOSIS — Z83518 Family history of other specified eye disorder: Secondary | ICD-10-CM | POA: Diagnosis not present

## 2022-05-17 DIAGNOSIS — H538 Other visual disturbances: Secondary | ICD-10-CM | POA: Diagnosis not present

## 2022-05-17 DIAGNOSIS — Z961 Presence of intraocular lens: Secondary | ICD-10-CM | POA: Diagnosis not present

## 2022-05-17 DIAGNOSIS — H02403 Unspecified ptosis of bilateral eyelids: Secondary | ICD-10-CM | POA: Diagnosis not present

## 2022-05-22 DIAGNOSIS — H4943 Progressive external ophthalmoplegia, bilateral: Secondary | ICD-10-CM | POA: Diagnosis not present

## 2022-05-22 DIAGNOSIS — H02403 Unspecified ptosis of bilateral eyelids: Secondary | ICD-10-CM | POA: Diagnosis not present

## 2022-07-05 DIAGNOSIS — H9313 Tinnitus, bilateral: Secondary | ICD-10-CM | POA: Diagnosis not present

## 2022-07-05 DIAGNOSIS — H4943 Progressive external ophthalmoplegia, bilateral: Secondary | ICD-10-CM | POA: Diagnosis not present

## 2022-07-12 ENCOUNTER — Ambulatory Visit
Admission: RE | Admit: 2022-07-12 | Discharge: 2022-07-12 | Disposition: A | Discharge status: Home / Self Care | Payer: No Typology Code available for payment source | Source: Ambulatory Visit | Attending: Family Medicine | Admitting: Family Medicine

## 2022-07-12 DIAGNOSIS — E2839 Other primary ovarian failure: Secondary | ICD-10-CM

## 2022-07-12 DIAGNOSIS — Z780 Asymptomatic menopausal state: Secondary | ICD-10-CM | POA: Diagnosis not present

## 2022-08-15 DIAGNOSIS — G608 Other hereditary and idiopathic neuropathies: Secondary | ICD-10-CM | POA: Diagnosis not present

## 2022-08-15 DIAGNOSIS — G629 Polyneuropathy, unspecified: Secondary | ICD-10-CM | POA: Diagnosis not present

## 2022-08-15 DIAGNOSIS — Q999 Chromosomal abnormality, unspecified: Secondary | ICD-10-CM | POA: Diagnosis not present

## 2022-08-15 DIAGNOSIS — G729 Myopathy, unspecified: Secondary | ICD-10-CM | POA: Diagnosis not present

## 2022-10-05 ENCOUNTER — Encounter (HOSPITAL_COMMUNITY): Payer: Self-pay

## 2022-10-05 ENCOUNTER — Other Ambulatory Visit: Payer: Self-pay

## 2022-10-05 ENCOUNTER — Emergency Department (HOSPITAL_BASED_OUTPATIENT_CLINIC_OR_DEPARTMENT_OTHER): Payer: Medicare HMO

## 2022-10-05 ENCOUNTER — Encounter (HOSPITAL_BASED_OUTPATIENT_CLINIC_OR_DEPARTMENT_OTHER): Payer: Self-pay | Admitting: Radiology

## 2022-10-05 ENCOUNTER — Inpatient Hospital Stay (HOSPITAL_BASED_OUTPATIENT_CLINIC_OR_DEPARTMENT_OTHER)
Admission: EM | Admit: 2022-10-05 | Discharge: 2022-10-07 | DRG: 392 | Disposition: A | Discharge status: Home / Self Care | Payer: Medicare HMO | Attending: Internal Medicine | Admitting: Internal Medicine

## 2022-10-05 DIAGNOSIS — R109 Unspecified abdominal pain: Secondary | ICD-10-CM | POA: Diagnosis not present

## 2022-10-05 DIAGNOSIS — K529 Noninfective gastroenteritis and colitis, unspecified: Secondary | ICD-10-CM | POA: Diagnosis not present

## 2022-10-05 DIAGNOSIS — R197 Diarrhea, unspecified: Secondary | ICD-10-CM | POA: Diagnosis not present

## 2022-10-05 DIAGNOSIS — Z833 Family history of diabetes mellitus: Secondary | ICD-10-CM | POA: Diagnosis not present

## 2022-10-05 DIAGNOSIS — Z9849 Cataract extraction status, unspecified eye: Secondary | ICD-10-CM

## 2022-10-05 DIAGNOSIS — K5730 Diverticulosis of large intestine without perforation or abscess without bleeding: Secondary | ICD-10-CM | POA: Diagnosis not present

## 2022-10-05 DIAGNOSIS — K56609 Unspecified intestinal obstruction, unspecified as to partial versus complete obstruction: Principal | ICD-10-CM

## 2022-10-05 DIAGNOSIS — A084 Viral intestinal infection, unspecified: Principal | ICD-10-CM | POA: Diagnosis present

## 2022-10-05 DIAGNOSIS — R1032 Left lower quadrant pain: Secondary | ICD-10-CM | POA: Diagnosis not present

## 2022-10-05 LAB — COMPREHENSIVE METABOLIC PANEL
ALT: 16 U/L (ref 0–44)
AST: 19 U/L (ref 15–41)
Albumin: 4.6 g/dL (ref 3.5–5.0)
Alkaline Phosphatase: 69 U/L (ref 38–126)
Anion gap: 10 (ref 5–15)
BUN: 14 mg/dL (ref 8–23)
CO2: 34 mmol/L — ABNORMAL HIGH (ref 22–32)
Calcium: 10.2 mg/dL (ref 8.9–10.3)
Chloride: 97 mmol/L — ABNORMAL LOW (ref 98–111)
Creatinine, Ser: 0.87 mg/dL (ref 0.44–1.00)
GFR, Estimated: >60 mL/min (ref >60)
Glucose, Bld: 119 mg/dL — ABNORMAL HIGH (ref 70–99)
Potassium: 4.5 mmol/L (ref 3.5–5.1)
Sodium: 141 mmol/L (ref 135–145)
Total Bilirubin: 0.5 mg/dL (ref 0.3–1.2)
Total Protein: 7.9 g/dL (ref 6.5–8.1)

## 2022-10-05 LAB — LIPASE, BLOOD: Lipase: 15 U/L (ref 11–51)

## 2022-10-05 LAB — URINALYSIS, ROUTINE W REFLEX MICROSCOPIC
Bilirubin Urine: NEGATIVE
Glucose, UA: NEGATIVE mg/dL
Hgb urine dipstick: NEGATIVE
Ketones, ur: NEGATIVE mg/dL
Leukocytes,Ua: NEGATIVE
Nitrite: NEGATIVE
Protein, ur: 30 mg/dL — AB
Specific Gravity, Urine: 1.018 (ref 1.005–1.030)
pH: 8.5 — ABNORMAL HIGH (ref 5.0–8.0)

## 2022-10-05 LAB — CBC
HCT: 44.6 % (ref 36.0–46.0)
Hemoglobin: 14.7 g/dL (ref 12.0–15.0)
MCH: 29.2 pg (ref 26.0–34.0)
MCHC: 33 g/dL (ref 30.0–36.0)
MCV: 88.7 fL (ref 80.0–100.0)
Platelets: 261 10*3/uL (ref 150–400)
RBC: 5.03 MIL/uL (ref 3.87–5.11)
RDW: 13.3 % (ref 11.5–15.5)
WBC: 6.8 10*3/uL (ref 4.0–10.5)
nRBC: 0 % (ref 0.0–0.2)

## 2022-10-05 MED ORDER — ONDANSETRON HCL 4 MG/2ML IJ SOLN: 4.0000 mg | Freq: Four times a day (QID) | INTRAMUSCULAR | Status: DC | PRN

## 2022-10-05 MED ORDER — ONDANSETRON HCL 4 MG PO TABS: 4.0000 mg | ORAL_TABLET | Freq: Four times a day (QID) | ORAL | Status: DC | PRN

## 2022-10-05 MED ORDER — ONDANSETRON HCL 4 MG/2ML IJ SOLN
4.0000 mg | Freq: Once | INTRAMUSCULAR | Status: AC
  Administered 2022-10-05: 4 mg via INTRAVENOUS

## 2022-10-05 MED ORDER — ONDANSETRON HCL 4 MG/2ML IJ SOLN
INTRAMUSCULAR | Status: AC
  Filled 2022-10-05: qty 2

## 2022-10-05 MED ORDER — ENOXAPARIN SODIUM 40 MG/0.4ML IJ SOSY
40.0000 mg | PREFILLED_SYRINGE | INTRAMUSCULAR | Status: DC
  Administered 2022-10-06 – 2022-10-07 (×2): 40 mg via SUBCUTANEOUS
  Filled 2022-10-05 (×2): qty 0.4

## 2022-10-05 MED ORDER — ACETAMINOPHEN 325 MG PO TABS
650.0000 mg | ORAL_TABLET | Freq: Four times a day (QID) | ORAL | Status: DC | PRN
  Administered 2022-10-06 – 2022-10-07 (×2): 650 mg via ORAL
  Filled 2022-10-05 (×2): qty 2

## 2022-10-05 MED ORDER — ACETAMINOPHEN 650 MG RE SUPP: 650.0000 mg | Freq: Four times a day (QID) | RECTAL | Status: DC | PRN

## 2022-10-05 MED ORDER — CYCLOSPORINE 0.05 % OP EMUL
1.0000 [drp] | Freq: Two times a day (BID) | OPHTHALMIC | Status: DC
  Administered 2022-10-06 – 2022-10-07 (×4): 1 [drp] via OPHTHALMIC
  Filled 2022-10-05 (×4): qty 30

## 2022-10-05 MED ORDER — IOHEXOL 300 MG/ML  SOLN
100.0000 mL | Freq: Once | INTRAMUSCULAR | Status: AC | PRN
  Administered 2022-10-05: 80 mL via INTRAVENOUS

## 2022-10-05 MED ORDER — OXYCODONE HCL 5 MG PO TABS: 5.0000 mg | ORAL_TABLET | ORAL | Status: DC | PRN

## 2022-10-05 NOTE — ED Provider Notes (Signed)
[facility] EMERGENCY DEPT Provider Note   CSN: [id number] Arrival date & time: 10/05/22  1616     History  Chief Complaint  Patient presents with   Abdominal Pain    Summer Wood is a 81 y.o. female.  Patient is a 81 year old female who presents with abdominal pain.  She states it started around noon today.  Its across the lower abdomen.  She says its been constant since it started.  She did have an episode of vomiting here in the emergency department.  She ate some [place] food last night and had some diarrhea after but has not had any ongoing diarrhea.  No known fevers.  No urinary symptoms.       Home Medications Prior to Admission medications   Medication Sig Start Date End Date Taking? Authorizing Provider  cycloSPORINE (RESTASIS) 0.05 % ophthalmic emulsion Place 1 drop into both eyes 2 (two) times daily. 04/20/20   [provider]      Allergies    Patient has no known allergies.    Review of Systems   Review of Systems  Constitutional:  Negative for chills, diaphoresis, fatigue and fever.  HENT:  Negative for congestion, rhinorrhea and sneezing.   Eyes: Negative.   Respiratory:  Negative for cough, chest tightness and shortness of breath.   Cardiovascular:  Negative for chest pain and leg swelling.  Gastrointestinal:  Positive for abdominal pain, nausea and vomiting. Negative for blood in stool and diarrhea.  Genitourinary:  Negative for difficulty urinating, flank pain, frequency and hematuria.  Musculoskeletal:  Negative for arthralgias and back pain.  Skin:  Negative for rash.  Neurological:  Negative for dizziness, speech difficulty, weakness, numbness and headaches.    Physical Exam Updated Vital Signs BP 131/63   Pulse (!) 58   Temp 97.6 F (36.4 C) (Oral)   Resp 18   Ht 5' 5.5" (1.664 m)   Wt 88.9 kg   SpO2 97%   BMI 32.12 kg/m  Physical Exam Constitutional:      Appearance: She is well-developed.  HENT:     Head:  Normocephalic and atraumatic.  Eyes:     Pupils: Pupils are equal, round, and reactive to light.  Cardiovascular:     Rate and Rhythm: Normal rate and regular rhythm.     Heart sounds: Normal heart sounds.  Pulmonary:     Effort: Pulmonary effort is normal. No respiratory distress.     Breath sounds: Normal breath sounds. No wheezing or rales.  Chest:     Chest wall: No tenderness.  Abdominal:     General: Bowel sounds are normal.     Palpations: Abdomen is soft.     Tenderness: There is abdominal tenderness in the left lower quadrant. There is no guarding or rebound.  Musculoskeletal:        General: Normal range of motion.     Cervical back: Normal range of motion and neck supple.  Lymphadenopathy:     Cervical: No cervical adenopathy.  Skin:    General: Skin is warm and dry.     Findings: No rash.  Neurological:     Mental Status: She is alert and oriented to person, place, and time.     ED Results / Procedures / Treatments   Labs (all labs ordered are listed, but only abnormal results are displayed) Labs Reviewed  COMPREHENSIVE METABOLIC PANEL - Abnormal; Notable for the following components:      Result Value   Chloride 97 (*)  CO2 34 (*)    Glucose, Bld 119 (*)    All other components within normal limits  URINALYSIS, ROUTINE W REFLEX MICROSCOPIC - Abnormal; Notable for the following components:   pH 8.5 (*)    Protein, ur 30 (*)    All other components within normal limits  LIPASE, BLOOD  CBC    EKG None  Radiology CT Abdomen Pelvis W Contrast  Result Date: 10/05/2022 CLINICAL DATA:  Left lower quadrant abdominal pain since earlier today. EXAM: CT ABDOMEN AND PELVIS WITH CONTRAST TECHNIQUE: Multidetector CT imaging of the abdomen and pelvis was performed using the standard protocol following bolus administration of intravenous contrast. RADIATION DOSE REDUCTION: This exam was performed according to the departmental dose-optimization program which includes  automated exposure control, adjustment of the mA and/or kV according to patient size and/or use of iterative reconstruction technique. CONTRAST:  74m OMNIPAQUE IOHEXOL 300 MG/ML  SOLN COMPARISON:  None Available. FINDINGS: Lower chest: Mildly enlarged heart. Linear atelectasis or scarring at both lung bases. Bilateral lower lobe calcified granulomata. Hepatobiliary: Several right lobe liver cysts. Unremarkable gallbladder. Pancreas: Moderate diffuse pancreatic atrophy. Spleen: Normal in size without focal abnormality. Adrenals/Urinary Tract: Adrenal glands are unremarkable. Kidneys are normal, without renal calculi, focal lesion, or hydronephrosis. Bladder is unremarkable. Stomach/Bowel: Normal appearing stomach. Multiple sigmoid colon diverticula without evidence of diverticulitis. Normal-appearing retrocecal appendix. Multiple mildly to moderately dilated loops of mid small bowel with diffuse mucosal enhancement. No wall thickening or pneumatosis. There is some swirling of the central mesentery, best seen in the sagittal plane. Vascular/Lymphatic: Mild atheromatous arterial calcifications without aneurysm. No enlarged lymph nodes. Reproductive: Uterus and bilateral adnexa are unremarkable. Other: No abdominal wall hernia or abnormality. No abdominopelvic ascites. Musculoskeletal: Lumbar and lower thoracic spine degenerative changes. IMPRESSION: 1. Multiple mildly to moderately dilated loops of mid small bowel with diffuse mucosal enhancement. This could be due to partial small bowel obstruction due to an internal hernia. 2. Sigmoid diverticulosis without evidence of diverticulitis. Electronically Signed   By: SClaudie ReveringM.D.   On: 10/05/2022 19:49    Procedures Procedures    Medications Ordered in ED Medications  ondansetron (ZOFRAN) injection 4 mg (0 mg Intravenous Not Given 10/05/22 1808)  iohexol (OMNIPAQUE) 300 MG/ML solution 100 mL (80 mLs Intravenous Contrast Given 10/05/22 1919)    ED Course/  Medical Decision Making/ A&P                           Medical Decision Making Amount and/or Complexity of Data Reviewed Labs: ordered. Radiology: ordered.  Risk Prescription drug management. Decision regarding hospitalization.   Patient is a 81year old who presents with abdominal pain.  She had some associated nausea and vomiting.  Her labs are reviewed and are nonconcerning.  Her urine is not consistent with infection.  CT scan shows evidence of a partial small bowel obstruction.  She has had no prior abdominal surgeries.  We will place an NG tube.  I spoke with Dr. LBobbye Mortonwith general surgery who will follow along with the patient.  Request a hospitalist admission.  I spoke with Dr. HNevada Cranewho will admit the patient.  Final Clinical Impression(s) / ED Diagnoses Final diagnoses:  SBO (small bowel obstruction) ([facility]    Rx / DC Orders ED Discharge Orders     None         BMalvin Johns MD 10/05/22 2115

## 2022-10-05 NOTE — ED Triage Notes (Signed)
Patient here POV from Home.  Endorses ABD Pain that began approximately 4.5 Hours ago. Mainly located across Lower ABD.   No Dysuria. No N/V. Diarrhea Last PM. No Known Fevers.   NAD noted during Triage. A&Ox4. GCS 15. Ambulatory.

## 2022-10-06 ENCOUNTER — Inpatient Hospital Stay (HOSPITAL_COMMUNITY): Payer: Medicare HMO

## 2022-10-06 DIAGNOSIS — K56609 Unspecified intestinal obstruction, unspecified as to partial versus complete obstruction: Secondary | ICD-10-CM

## 2022-10-06 DIAGNOSIS — R109 Unspecified abdominal pain: Secondary | ICD-10-CM

## 2022-10-06 LAB — CBC
HCT: 42.9 % (ref 36.0–46.0)
Hemoglobin: 14.1 g/dL (ref 12.0–15.0)
MCH: 29.4 pg (ref 26.0–34.0)
MCHC: 32.9 g/dL (ref 30.0–36.0)
MCV: 89.4 fL (ref 80.0–100.0)
Platelets: 246 10*3/uL (ref 150–400)
RBC: 4.8 MIL/uL (ref 3.87–5.11)
RDW: 13.2 % (ref 11.5–15.5)
WBC: 8.5 10*3/uL (ref 4.0–10.5)
nRBC: 0 % (ref 0.0–0.2)

## 2022-10-06 LAB — CREATININE, SERUM
Creatinine, Ser: 0.81 mg/dL (ref 0.44–1.00)
GFR, Estimated: >60 mL/min (ref >60)

## 2022-10-06 MED ORDER — LACTATED RINGERS IV SOLN: INTRAVENOUS | Status: DC

## 2022-10-06 NOTE — H&P (Signed)
History and Physical    Summer Wood Summer Wood:[id number] DOB: 1941/10/16 DOA: 10/05/2022  PCP: Kathyrn Lass, MD   Chief Complaint: abd pain  HPI: Summer Wood is a 81 y.o. female with no significant past medical history presents emergency department droppage with abdominal pain.  Patient been having approximately 4 hours of abdominal pain presented to outside emergency department was found to be vomiting.  She presented to the ER where she is found to be afebrile hemodynamically stable.  She was endorsing abdominal tenderness particularly in her lower quadrants.  Labs were obtained which revealed lipase within normal limits, CMP largely unrevealing, CBC unrevealing, urinalysis unrevealing, CT abdomen pelvis was obtained which showed multiple dilated loops of small bowel with diffuse mucosal enhancement concerning for possible partial bowel obstruction.  She also had evidence of sigmoid diverticulosis.  Patient was transferred to [facility] for further evaluation.  On assessment she was resting comfortably.  She had some vomiting and diarrhea after having [place] food the day before.  She denied any fever or chills.  She had a bowel movement earlier in the day which she described as loose in consistency.   Review of Systems: Review of Systems  All other systems reviewed and are negative.    As per HPI otherwise 10 point review of systems negative.   No Known Allergies  History reviewed. No pertinent past medical history.  Past Surgical History:  Procedure Laterality Date   CATARACT EXTRACTION     eye lift       reports that she has never smoked. She has never used smokeless tobacco. She reports that she does not drink alcohol and does not use drugs.  Family History  Problem Relation Age of Onset   Schizophrenia Mother    Varicose Veins Mother    Diabetes Father     Prior to Admission medications   Medication Sig Start Date End Date Taking? Authorizing Provider   cycloSPORINE (RESTASIS) 0.05 % ophthalmic emulsion Place 1 drop into both eyes 2 (two) times daily. 04/20/20   [provider]    Physical Exam: Vitals:   10/05/22 1900 10/05/22 1915 10/05/22 2015 10/05/22 2301  BP: 124/62 131/63 139/68 131/71  Pulse: 61 (!) 58 60 72  Resp:  '18 17 17  '$ Temp:   98 F (36.7 C) 98 F (36.7 C)  TempSrc:   Oral Oral  SpO2: 94% 97% 99% 97%  Weight:      Height:       Physical Exam Constitutional:      Appearance: She is normal weight.  Cardiovascular:     Rate and Rhythm: Normal rate and regular rhythm.     Heart sounds: Normal heart sounds.  Abdominal:     General: Abdomen is flat. Bowel sounds are normal.     Palpations: Abdomen is soft. There is no fluid wave, hepatomegaly or mass.     Tenderness: There is generalized abdominal tenderness.  Skin:    General: Skin is warm.     Capillary Refill: Capillary refill takes less than 2 seconds.  Neurological:     General: No focal deficit present.     Mental Status: She is alert.  Psychiatric:        Mood and Affect: Mood normal.       Labs on Admission: I have personally reviewed the patients's labs and imaging studies.  Assessment/Plan Principal Problem:   SBO (small bowel obstruction) (HCC) Active Problems:   Small bowel obstruction (HCC) Viral  gastroenteritis - Patient had symptoms of nausea, vomiting and diarrhea in setting of CT scan with findings concerning for developing partial small bowel obstruction - Patient unlikely to have small bowel obstruction if having recent bowel movements with diarrhea - Could have a component of overflow diarrhea in setting of chronic constipation - Status post NG tube placement  Plan: Remove NG tube Placed on clear liquid diet Optimize electrolyte repletion KUB in morning if tenderness does not improve IV fluids As needed Zofran for nausea  Admission status: Inpatient Med-Surg  Certification: The appropriate patient status for this  patient is INPATIENT. Inpatient status is judged to be reasonable and necessary in order to provide the required intensity of service to ensure the patient's safety. The patient's presenting symptoms, physical exam findings, and initial radiographic and laboratory data in the context of their chronic comorbidities is felt to place them at high risk for further clinical deterioration. Furthermore, it is not anticipated that the patient will be medically stable for discharge from the hospital within 2 midnights of admission.   * I certify that at the point of admission it is my clinical judgment that the patient will require inpatient hospital care spanning beyond 2 midnights from the point of admission due to high intensity of service, high risk for further deterioration and high frequency of surveillance required.Emilee Hero MD [facility] If 7PM-7AM, please contact night-coverage [email or web address]  10/06/2022, 12:37 AM

## 2022-10-06 NOTE — Hospital Course (Addendum)
Summer Wood is a 81 y.o. female with no significant past medical history presented to hospital with acute onset of abdominal pain, nausea and vomiting with diarrhea.  No history of fever or chills..  Previous history of abdominal surgery.  Patient was hemodynamically stable.  Had some abdominal tenderness in the lower quadrant.  Labs were unremarkable.  UA was negative for infection.  CT abdomen pelvis was obtained which showed multiple dilated loops of small bowel with diffuse mucosal enhancement concerning for possible partial bowel obstruction.  She also had evidence of sigmoid diverticulosis.  Patient was transferred to [facility] for further evaluation.

## 2022-10-06 NOTE — Progress Notes (Signed)
Same day note Summer Wood is a 81 y.o. female with no significant past medical history presented to hospital with acute onset of abdominal pain, nausea and vomiting with diarrhea.  No history of fever or chills..  Previous history of abdominal surgery.  Patient was hemodynamically stable.  Had some abdominal tenderness in the lower quadrant.  Labs were unremarkable.  UA was negative for infection.  CT abdomen pelvis was obtained which showed multiple dilated loops of small bowel with diffuse mucosal enhancement concerning for possible partial bowel obstruction.  She also had evidence of sigmoid diverticulosis.  Patient was transferred to [facility] for further evaluation.    Patient seen and examined at bedside.  Patient was admitted to the hospital for vomiting diarrhea and abdominal pain.  At the time of my evaluation, patient complains of feeling little better.  No nausea vomiting or abdominal pain has not had a bowel movement since Thursday night.  Wants to try clears.  Physical examination reveals elderly female, not in obvious distress, abdomen soft.  Hydration status okay.  Laboratory data and imaging was reviewed   Assessment and plan.  SBO (small bowel obstruction) as per CT scan.  Clinically improving. Viral gastroenteritis Symptoms started after having [place] food.  CT scan showing concerns for developing partial small bowel obstruction.  Has not had a bowel movement since Thursday but initially had a lot of diarrhea.  No nausea vomiting abdominal pain today.  Feels better..  Was put on NG tube initially which has been removed.   Check abdominal x-ray.  General surgery was notified on initial admission.  Continue telemetry precautions.  Will start on clears.  Advance to full liquids by the evening if continues to improve.  Spoke with the patient's husband and son at bedside.  No Charge  Signed,  Delila Pereyra, MD [facility]

## 2022-10-07 DIAGNOSIS — K529 Noninfective gastroenteritis and colitis, unspecified: Secondary | ICD-10-CM

## 2022-10-07 LAB — GASTROINTESTINAL PANEL BY PCR, STOOL (REPLACES STOOL CULTURE)

## 2022-10-07 LAB — CBC
HCT: 38.7 % (ref 36.0–46.0)
Hemoglobin: 13 g/dL (ref 12.0–15.0)
MCH: 29.7 pg (ref 26.0–34.0)
MCHC: 33.6 g/dL (ref 30.0–36.0)
MCV: 88.4 fL (ref 80.0–100.0)
Platelets: 223 10*3/uL (ref 150–400)
RBC: 4.38 MIL/uL (ref 3.87–5.11)
RDW: 13.2 % (ref 11.5–15.5)
WBC: 7.1 10*3/uL (ref 4.0–10.5)
nRBC: 0 % (ref 0.0–0.2)

## 2022-10-07 LAB — BASIC METABOLIC PANEL
Anion gap: 8 (ref 5–15)
BUN: 6 mg/dL — ABNORMAL LOW (ref 8–23)
CO2: 26 mmol/L (ref 22–32)
Calcium: 8.8 mg/dL — ABNORMAL LOW (ref 8.9–10.3)
Chloride: 103 mmol/L (ref 98–111)
Creatinine, Ser: 0.7 mg/dL (ref 0.44–1.00)
GFR, Estimated: >60 mL/min (ref >60)
Glucose, Bld: 111 mg/dL — ABNORMAL HIGH (ref 70–99)
Potassium: 3.6 mmol/L (ref 3.5–5.1)
Sodium: 137 mmol/L (ref 135–145)

## 2022-10-07 LAB — MAGNESIUM: Magnesium: 2 mg/dL (ref 1.7–2.4)

## 2022-10-07 NOTE — Discharge Summary (Signed)
Physician Discharge Summary  Summer Wood ELF:[id number] DOB: Apr 20, 1941 DOA: 10/05/2022  PCP: Kathyrn Lass, MD  Admit date: 10/05/2022 Discharge date: 10/07/2022  Admitted From: Home  Discharge disposition: Home  Recommendations for Outpatient Follow-Up:   Follow up with your primary care provider in one week.  Check CBC, BMP, magnesium in the next visit  Discharge Diagnosis:   Principal Problem:   Acute gastroenteritis resolved   Discharge Condition: Improved.  Diet recommendation: Regular diet  Wound care: None.  Code status: Full.   History of Present Illness:   Summer Wood is a 81 y.o. female with no significant past medical history presented to hospital with acute onset of abdominal pain, nausea and vomiting with diarrhea.  No history of fever or chills..  Previous history of abdominal surgery.  Patient was hemodynamically stable.  Had some abdominal tenderness in the lower quadrant.  Labs were unremarkable.  UA was negative for infection.  CT abdomen pelvis was obtained which showed multiple dilated loops of small bowel with diffuse mucosal enhancement concerning for possible partial bowel obstruction.  She also had evidence of sigmoid diverticulosis.  Patient was transferred to [facility] for further evaluation.     Hospital Course:   Following conditions were addressed during hospitalization as listed below,  Acute  gastroenteritis Symptoms started after having [place] food.  CT scan showing concerns for developing partial small bowel obstruction but patient had rapid medical improvement.  Repeat abdominal x-ray with nonspecific bowel gas pattern.  Small bowel obstruction was ruled out. Patient has tolerated oral diet.  Has been advised liquid to soft diet for a day or 2 and advance as tolerated.  Disposition.  At this time, patient is stable for disposition home with outpatient PCP follow-up.  Medical Consultants:   None.  Procedures:     NG tube placement and removal Subjective:   Today, patient was seen and examined at bedside.  Denies any nausea vomiting abdominal pain.  Has had 3 bowel movements yesterday.  Feels good.  Has tolerated oral diet.  Discharge Exam:   Vitals:   10/06/22 2137 10/07/22 0530  BP: 126/67 129/68  Pulse: 64 70  Resp: 16 18  Temp: 97.9 F (36.6 C) 97.8 F (36.6 C)  SpO2: 98% 94%   Vitals:   10/06/22 0753 10/06/22 1559 10/06/22 2137 10/07/22 0530  BP: 138/79 130/79 126/67 129/68  Pulse: 82 66 64 70  Resp: '17 18 16 18  '$ Temp: 97.8 F (36.6 C) 97.6 F (36.4 C) 97.9 F (36.6 C) 97.8 F (36.6 C)  TempSrc:  Oral Oral Oral  SpO2: 95% 95% 98% 94%  Weight:      Height:       General: Alert awake, not in obvious distress HENT: pupils equally reacting to light,  No scleral pallor or icterus noted. Oral mucosa is moist.  Chest:  Clear breath sounds.  Diminished breath sounds bilaterally. No crackles or wheezes.  CVS: S1 &S2 heard. No murmur.  Regular rate and rhythm. Abdomen: Soft, nontender, nondistended.  Bowel sounds are heard.   Extremities: No cyanosis, clubbing or edema.  Peripheral pulses are palpable. Psych: Alert, awake and oriented, normal mood CNS:  No cranial nerve deficits.  Power equal in all extremities.   Skin: Warm and dry.  No rashes noted.  The results of significant diagnostics from this hospitalization (including imaging, microbiology, ancillary and laboratory) are listed below for reference.     Diagnostic Studies:   DG Abd 1 View  Result Date: 10/06/2022 CLINICAL DATA:  Acute abdominal pain. EXAM: ABDOMEN - 1 VIEW COMPARISON:  10/05/2022 CT FINDINGS: There are few UPPER limits of normal caliber small bowel loops noted within the abdomen. A small amount of gas/stool within the colon is present. No suspicious calcifications are present. No acute bony abnormalities are identified. IMPRESSION: Unchanged nonspecific bowel gas pattern. Electronically Signed   By:  Margarette Canada M.D.   On: 10/06/2022 13:25   CT Abdomen Pelvis W Contrast  Result Date: 10/05/2022 CLINICAL DATA:  Left lower quadrant abdominal pain since earlier today. EXAM: CT ABDOMEN AND PELVIS WITH CONTRAST TECHNIQUE: Multidetector CT imaging of the abdomen and pelvis was performed using the standard protocol following bolus administration of intravenous contrast. RADIATION DOSE REDUCTION: This exam was performed according to the departmental dose-optimization program which includes automated exposure control, adjustment of the mA and/or kV according to patient size and/or use of iterative reconstruction technique. CONTRAST:  16m OMNIPAQUE IOHEXOL 300 MG/ML  SOLN COMPARISON:  None Available. FINDINGS: Lower chest: Mildly enlarged heart. Linear atelectasis or scarring at both lung bases. Bilateral lower lobe calcified granulomata. Hepatobiliary: Several right lobe liver cysts. Unremarkable gallbladder. Pancreas: Moderate diffuse pancreatic atrophy. Spleen: Normal in size without focal abnormality. Adrenals/Urinary Tract: Adrenal glands are unremarkable. Kidneys are normal, without renal calculi, focal lesion, or hydronephrosis. Bladder is unremarkable. Stomach/Bowel: Normal appearing stomach. Multiple sigmoid colon diverticula without evidence of diverticulitis. Normal-appearing retrocecal appendix. Multiple mildly to moderately dilated loops of mid small bowel with diffuse mucosal enhancement. No wall thickening or pneumatosis. There is some swirling of the central mesentery, best seen in the sagittal plane. Vascular/Lymphatic: Mild atheromatous arterial calcifications without aneurysm. No enlarged lymph nodes. Reproductive: Uterus and bilateral adnexa are unremarkable. Other: No abdominal wall hernia or abnormality. No abdominopelvic ascites. Musculoskeletal: Lumbar and lower thoracic spine degenerative changes. IMPRESSION: 1. Multiple mildly to moderately dilated loops of mid small bowel with diffuse  mucosal enhancement. This could be due to partial small bowel obstruction due to an internal hernia. 2. Sigmoid diverticulosis without evidence of diverticulitis. Electronically Signed   By: SClaudie ReveringM.D.   On: 10/05/2022 19:49     Labs:   Basic Metabolic Panel: Recent Labs  Lab 10/05/22 1632 10/06/22 0314 10/07/22 0236  NA 141  --  137  K 4.5  --  3.6  CL 97*  --  103  CO2 34*  --  26  GLUCOSE 119*  --  111*  BUN 14  --  6*  CREATININE 0.87 0.81 0.70  CALCIUM 10.2  --  8.8*  MG  --   --  2.0   GFR Estimated Creatinine Clearance: 64.5 mL/min (by C-G formula based on SCr of 0.7 mg/dL). Liver Function Tests: Recent Labs  Lab 10/05/22 1632  AST 19  ALT 16  ALKPHOS 69  BILITOT 0.5  PROT 7.9  ALBUMIN 4.6   Recent Labs  Lab 10/05/22 1632  LIPASE 15   No results for input(s): "AMMONIA" in the last 168 hours. Coagulation profile No results for input(s): "INR", "PROTIME" in the last 168 hours.  CBC: Recent Labs  Lab 10/05/22 1632 10/06/22 0314 10/07/22 0236  WBC 6.8 8.5 7.1  HGB 14.7 14.1 13.0  HCT 44.6 42.9 38.7  MCV 88.7 89.4 88.4  PLT 261 246 223   Cardiac Enzymes: No results for input(s): "CKTOTAL", "CKMB", "CKMBINDEX", "TROPONINI" in the last 168 hours. BNP: Invalid input(s): "POCBNP" CBG: No results for input(s): "GLUCAP" in the last 168 hours. D-Dimer  No results for input(s): "DDIMER" in the last 72 hours. Hgb A1c No results for input(s): "HGBA1C" in the last 72 hours. Lipid Profile No results for input(s): "CHOL", "HDL", "LDLCALC", "TRIG", "CHOLHDL", "LDLDIRECT" in the last 72 hours. Thyroid function studies No results for input(s): "TSH", "T4TOTAL", "T3FREE", "THYROIDAB" in the last 72 hours.  Invalid input(s): "FREET3" Anemia work up No results for input(s): "VITAMINB12", "FOLATE", "FERRITIN", "TIBC", "IRON", "RETICCTPCT" in the last 72 hours. Microbiology Recent Results (from the past 240 hour(s))  Gastrointestinal Panel by PCR , Stool      Status: None   Collection Time: 10/06/22  1:41 PM   Specimen: Stool  Result Value Ref Range Status   Campylobacter species NOT DETECTED NOT DETECTED Final   Plesimonas shigelloides NOT DETECTED NOT DETECTED Final   Salmonella species NOT DETECTED NOT DETECTED Final   Yersinia enterocolitica NOT DETECTED NOT DETECTED Final   Vibrio species NOT DETECTED NOT DETECTED Final   Vibrio cholerae NOT DETECTED NOT DETECTED Final   Enteroaggregative E coli (EAEC) NOT DETECTED NOT DETECTED Final   Enteropathogenic E coli (EPEC) NOT DETECTED NOT DETECTED Final   Enterotoxigenic E coli (ETEC) NOT DETECTED NOT DETECTED Final   Shiga like toxin producing E coli (STEC) NOT DETECTED NOT DETECTED Final   Shigella/Enteroinvasive E coli (EIEC) NOT DETECTED NOT DETECTED Final   Cryptosporidium NOT DETECTED NOT DETECTED Final   Cyclospora cayetanensis NOT DETECTED NOT DETECTED Final   Entamoeba histolytica NOT DETECTED NOT DETECTED Final   Giardia lamblia NOT DETECTED NOT DETECTED Final   Adenovirus F40/41 NOT DETECTED NOT DETECTED Final   Astrovirus NOT DETECTED NOT DETECTED Final   Norovirus GI/GII NOT DETECTED NOT DETECTED Final   Rotavirus A NOT DETECTED NOT DETECTED Final   Sapovirus (I, II, IV, and V) NOT DETECTED NOT DETECTED Final    Comment: Performed at [facility], [address], [place], [place] [address]     Discharge Instructions:   Discharge Instructions     Call MD for:  persistant nausea and vomiting   Complete by: As directed    Call MD for:  severe uncontrolled pain   Complete by: As directed    Diet general   Complete by: As directed    Discharge instructions   Complete by: As directed    Follow-up with your primary care physician in 1 week.  Stay on liquid/soft diet for the next day.  Advance diet as tolerated.  Seek medical attention for worsening symptoms.   Increase activity slowly   Complete by: As directed       Allergies as of 10/07/2022   No Known  Allergies      Medication List     TAKE these medications    Restasis 0.05 % ophthalmic emulsion Generic drug: cycloSPORINE Place 1 drop into both eyes 2 (two) times daily.          Time coordinating discharge: 39 minutes  Signed:  Faustino Luecke  [facility] 10/07/2022, 9:47 AM

## 2022-10-15 DIAGNOSIS — K529 Noninfective gastroenteritis and colitis, unspecified: Secondary | ICD-10-CM | POA: Diagnosis not present

## 2022-10-15 DIAGNOSIS — Z23 Encounter for immunization: Secondary | ICD-10-CM | POA: Diagnosis not present

## 2022-10-15 DIAGNOSIS — Z6829 Body mass index (BMI) 29.0-29.9, adult: Secondary | ICD-10-CM | POA: Diagnosis not present

## 2023-01-10 DIAGNOSIS — H02423 Myogenic ptosis of bilateral eyelids: Secondary | ICD-10-CM | POA: Diagnosis not present

## 2023-01-10 DIAGNOSIS — H4943 Progressive external ophthalmoplegia, bilateral: Secondary | ICD-10-CM | POA: Diagnosis not present

## 2023-01-30 DIAGNOSIS — L820 Inflamed seborrheic keratosis: Secondary | ICD-10-CM | POA: Diagnosis not present

## 2023-01-30 DIAGNOSIS — L708 Other acne: Secondary | ICD-10-CM | POA: Diagnosis not present

## 2023-01-30 DIAGNOSIS — L570 Actinic keratosis: Secondary | ICD-10-CM | POA: Diagnosis not present

## 2023-01-30 DIAGNOSIS — X32XXXD Exposure to sunlight, subsequent encounter: Secondary | ICD-10-CM | POA: Diagnosis not present

## 2023-02-20 DIAGNOSIS — H518 Other specified disorders of binocular movement: Secondary | ICD-10-CM | POA: Diagnosis not present

## 2023-02-20 DIAGNOSIS — H4943 Progressive external ophthalmoplegia, bilateral: Secondary | ICD-10-CM | POA: Diagnosis not present

## 2023-02-20 DIAGNOSIS — H02423 Myogenic ptosis of bilateral eyelids: Secondary | ICD-10-CM | POA: Diagnosis not present

## 2023-04-02 ENCOUNTER — Other Ambulatory Visit: Payer: Self-pay | Admitting: Family Medicine

## 2023-04-02 DIAGNOSIS — Z1231 Encounter for screening mammogram for malignant neoplasm of breast: Secondary | ICD-10-CM

## 2023-04-05 ENCOUNTER — Ambulatory Visit
Admission: RE | Admit: 2023-04-05 | Discharge: 2023-04-05 | Disposition: A | Discharge status: Home / Self Care | Payer: Medicare HMO | Source: Ambulatory Visit | Attending: Family Medicine | Admitting: Family Medicine

## 2023-04-05 DIAGNOSIS — Z1231 Encounter for screening mammogram for malignant neoplasm of breast: Secondary | ICD-10-CM

## 2023-04-10 DIAGNOSIS — H02423 Myogenic ptosis of bilateral eyelids: Secondary | ICD-10-CM | POA: Diagnosis not present

## 2023-05-14 DIAGNOSIS — I7090 Unspecified atherosclerosis: Secondary | ICD-10-CM | POA: Diagnosis not present

## 2023-05-14 DIAGNOSIS — Z1389 Encounter for screening for other disorder: Secondary | ICD-10-CM | POA: Diagnosis not present

## 2023-05-14 DIAGNOSIS — Z0000 Encounter for general adult medical examination without abnormal findings: Secondary | ICD-10-CM | POA: Diagnosis not present

## 2023-05-14 DIAGNOSIS — E663 Overweight: Secondary | ICD-10-CM | POA: Diagnosis not present

## 2023-05-14 DIAGNOSIS — E785 Hyperlipidemia, unspecified: Secondary | ICD-10-CM | POA: Diagnosis not present

## 2023-05-14 DIAGNOSIS — Z131 Encounter for screening for diabetes mellitus: Secondary | ICD-10-CM | POA: Diagnosis not present

## 2023-05-14 DIAGNOSIS — Z1211 Encounter for screening for malignant neoplasm of colon: Secondary | ICD-10-CM | POA: Diagnosis not present

## 2023-05-16 DIAGNOSIS — Z1211 Encounter for screening for malignant neoplasm of colon: Secondary | ICD-10-CM | POA: Diagnosis not present

## 2023-08-27 DIAGNOSIS — L708 Other acne: Secondary | ICD-10-CM | POA: Diagnosis not present

## 2023-08-27 DIAGNOSIS — L570 Actinic keratosis: Secondary | ICD-10-CM | POA: Diagnosis not present

## 2023-08-27 DIAGNOSIS — L820 Inflamed seborrheic keratosis: Secondary | ICD-10-CM | POA: Diagnosis not present

## 2023-08-27 DIAGNOSIS — X32XXXD Exposure to sunlight, subsequent encounter: Secondary | ICD-10-CM | POA: Diagnosis not present

## 2023-09-23 DIAGNOSIS — H01111 Allergic dermatitis of right upper eyelid: Secondary | ICD-10-CM | POA: Diagnosis not present

## 2023-09-23 DIAGNOSIS — D3121 Benign neoplasm of right retina: Secondary | ICD-10-CM | POA: Diagnosis not present

## 2023-09-23 DIAGNOSIS — H524 Presbyopia: Secondary | ICD-10-CM | POA: Diagnosis not present

## 2023-09-23 DIAGNOSIS — D3122 Benign neoplasm of left retina: Secondary | ICD-10-CM | POA: Diagnosis not present

## 2023-09-23 DIAGNOSIS — Z0100 Encounter for examination of eyes and vision without abnormal findings: Secondary | ICD-10-CM | POA: Diagnosis not present

## 2024-02-25 DIAGNOSIS — L570 Actinic keratosis: Secondary | ICD-10-CM | POA: Diagnosis not present

## 2024-02-25 DIAGNOSIS — X32XXXD Exposure to sunlight, subsequent encounter: Secondary | ICD-10-CM | POA: Diagnosis not present

## 2024-02-27 ENCOUNTER — Other Ambulatory Visit: Payer: Self-pay | Admitting: Family Medicine

## 2024-02-27 DIAGNOSIS — Z1231 Encounter for screening mammogram for malignant neoplasm of breast: Secondary | ICD-10-CM

## 2024-04-06 ENCOUNTER — Ambulatory Visit
Admission: RE | Admit: 2024-04-06 | Discharge: 2024-04-06 | Disposition: A | Discharge status: Home / Self Care | Source: Ambulatory Visit | Attending: Family Medicine | Admitting: Family Medicine

## 2024-04-06 DIAGNOSIS — Z1231 Encounter for screening mammogram for malignant neoplasm of breast: Secondary | ICD-10-CM | POA: Diagnosis not present

## 2024-04-21 DIAGNOSIS — X32XXXD Exposure to sunlight, subsequent encounter: Secondary | ICD-10-CM | POA: Diagnosis not present

## 2024-04-21 DIAGNOSIS — L570 Actinic keratosis: Secondary | ICD-10-CM | POA: Diagnosis not present

## 2024-05-14 DIAGNOSIS — E663 Overweight: Secondary | ICD-10-CM | POA: Diagnosis not present

## 2024-05-14 DIAGNOSIS — Z1331 Encounter for screening for depression: Secondary | ICD-10-CM | POA: Diagnosis not present

## 2024-05-14 DIAGNOSIS — Z131 Encounter for screening for diabetes mellitus: Secondary | ICD-10-CM | POA: Diagnosis not present

## 2024-05-14 DIAGNOSIS — H02403 Unspecified ptosis of bilateral eyelids: Secondary | ICD-10-CM | POA: Diagnosis not present

## 2024-05-14 DIAGNOSIS — Z8639 Personal history of other endocrine, nutritional and metabolic disease: Secondary | ICD-10-CM | POA: Diagnosis not present

## 2024-05-14 DIAGNOSIS — Z09 Encounter for follow-up examination after completed treatment for conditions other than malignant neoplasm: Secondary | ICD-10-CM | POA: Diagnosis not present

## 2024-05-14 DIAGNOSIS — Z23 Encounter for immunization: Secondary | ICD-10-CM | POA: Diagnosis not present

## 2024-05-14 DIAGNOSIS — Z0000 Encounter for general adult medical examination without abnormal findings: Secondary | ICD-10-CM | POA: Diagnosis not present

## 2024-05-15 DIAGNOSIS — Z1211 Encounter for screening for malignant neoplasm of colon: Secondary | ICD-10-CM | POA: Diagnosis not present

## 2024-08-28 DIAGNOSIS — R42 Dizziness and giddiness: Secondary | ICD-10-CM | POA: Diagnosis not present

## 2024-08-28 DIAGNOSIS — K5900 Constipation, unspecified: Secondary | ICD-10-CM | POA: Diagnosis not present

## 2024-09-03 DIAGNOSIS — H8112 Benign paroxysmal vertigo, left ear: Secondary | ICD-10-CM | POA: Diagnosis not present

## 2024-09-03 DIAGNOSIS — R42 Dizziness and giddiness: Secondary | ICD-10-CM | POA: Diagnosis not present

## 2024-09-23 ENCOUNTER — Encounter: Payer: Self-pay | Admitting: Physical Therapy

## 2024-09-23 ENCOUNTER — Other Ambulatory Visit: Payer: Self-pay

## 2024-09-23 ENCOUNTER — Ambulatory Visit: Attending: Family Medicine | Admitting: Physical Therapy

## 2024-09-23 DIAGNOSIS — R2681 Unsteadiness on feet: Secondary | ICD-10-CM | POA: Diagnosis not present

## 2024-09-23 DIAGNOSIS — R42 Dizziness and giddiness: Secondary | ICD-10-CM | POA: Diagnosis not present

## 2024-09-23 DIAGNOSIS — H8112 Benign paroxysmal vertigo, left ear: Secondary | ICD-10-CM | POA: Insufficient documentation

## 2024-09-23 NOTE — Therapy (Signed)
 OUTPATIENT PHYSICAL THERAPY VESTIBULAR TREATMENT     Patient Name: Summer Wood MRN: [id number] DOB:[birth year], 83 y.o., female Today's Date: 09/24/2024  END OF SESSION:  PT End of Session - 09/24/24 1617     Visit Number 2    Number of Visits 12    Date for Recertification  10/30/24    Authorization Type [employer]-auth submitted    PT Start Time 1538    PT Stop Time 1618    PT Time Calculation (min) 40 min    Equipment Utilized During Treatment Gait belt    Activity Tolerance Patient tolerated treatment well    Behavior During Therapy WFL for tasks assessed/performed           History reviewed. No pertinent past medical history. Past Surgical History:  Procedure Laterality Date   CATARACT EXTRACTION     eye lift     Patient Active Problem List   Diagnosis Date Noted   Acute gastroenteritis 10/05/2022    PCP: Pinal Planas, MD REFERRING PROVIDER: Dayna Motto, DO   REFERRING DIAG: [phone] (ICD-10-CM) - Benign paroxysmal vertigo, left ear   THERAPY DIAG:  BPPV (benign paroxysmal positional vertigo), left  Dizziness and giddiness  Unsteadiness on feet  ONSET DATE: 09/07/2024 (MD referral)  Rationale for Evaluation and Treatment: Rehabilitation  SUBJECTIVE:   SUBJECTIVE STATEMENT: Feeling better, but not 100%.  Pt accompanied by: significant other  PERTINENT HISTORY: Hx of dizziness since 2013, worsening in past 6 weeks (saw MD with c/o 09/01/24 and 09/07/24)  PAIN:  Are you having pain? No  PRECAUTIONS: Fall  RED FLAGS: None   WEIGHT BEARING RESTRICTIONS: No  FALLS: Has patient fallen in last 6 months? No  LIVING ENVIRONMENT: Lives with: lives with their spouse Lives in: House/apartment  PLOF: Independent  PATIENT GOALS: To fully get rid of dizziness  OBJECTIVE:     TODAY'S TREATMENT: 09/24/24   M-CTSIB  Condition 1: Firm Surface, EO 30 Sec, Mild Sway  Condition 2: Firm Surface, EC 25 Sec, Mild, Moderate, and   Sway; pt  opened eyes prematurely d/t fear  Condition 3: Foam Surface, EO 30 Sec, Mild and Moderate Sway  Condition 4: Foam Surface, EC 9 Sec, Moderate and Severe Sway     Activity Comments  DGI 13/24  HIT Positive on R side, negative on L; pt very guarded  L DH modified over pillow  Negative; dizziness and imbalance upon sitting up  R DH modified over pillow  Negative; dizziness and imbalance upon sitting up  R/L brandt daroff C/o some dizziness L>R  Sitting head turns to targets 30 Accommodated for limited ROM d/t neck stiffness    HOME EXERCISE PROGRAM Last updated: 09/24/24 Access Code: [email or web address] URL: [email or web address] Date: 09/24/2024 Prepared by: [facility] - Outpatient  Rehab - [facility]  Program Notes perform with husband present for safety.  Exercises - Brandt-Daroff Vestibular Exercise  - 1 x daily - 5 x weekly - 2 sets - 3-5 reps - Seated Left Head Turns Vestibular Habituation  - 1 x daily - 5 x weekly - 2 sets - 30 sec hold   PATIENT EDUCATION: Education details: exam findings and falls risk, advised pt to discontinue Epley at home at this time; HEP Person educated: Patient Education method: Explanation, Demonstration, Tactile cues, Verbal cues, and Handouts Education comprehension: verbalized understanding and returned demonstration    Note: Objective measures were completed at Evaluation unless otherwise noted.  DIAGNOSTIC FINDINGS: NA for this episode  COGNITION: Overall cognitive  status: Within functional limits for tasks assessed   SENSATION: Not tested  POSTURE:  rounded shoulders, forward head, and head slightly tilted toward R  Cervical ROM:    Active A/PROM (deg) eval  Flexion 25  Extension 10  Right lateral flexion   Left lateral flexion   Right rotation WFL  Left rotation WFL  (Blank rows = not tested)   BED MOBILITY:  Independent, slowed and guarded  TRANSFERS: Assistive device utilized: None  Sit to stand:  Modified independence Stand to sit: Modified independence  GAIT: Gait pattern: guarded, unsteady, holds to husband's arm and step through pattern Distance walked: 50 ft Assistive device utilized: holds to husband's arm Level of assistance: CGA Comments: Reports walking is better after seeing MD and him performing the Epley manuever  PATIENT SURVEYS:  DHI: THE DIZZINESS HANDICAP INVENTORY (DHI)  P1. Does looking up increase your problem? 0 = No  E2. Because of your problem, do you feel frustrated? 0 = No  F3. Because of your problem, do you restrict your travel for business or recreation?  2 = Sometimes  P4. Does walking down the aisle of a supermarket increase your problems?  4 = Yes  F5. Because of your problem, do you have difficulty getting into or out of bed?  2 = Sometimes  F6. Does your problem significantly restrict your participation in social activities, such as going out to dinner, going to the movies, dancing, or going to parties? 2 = Sometimes  F7. Because of your problem, do you have difficulty reading?  0 = No  P8. Does performing more ambitious activities such as sports, dancing, household chores (sweeping or putting dishes away) increase your problems?  4 = Yes  E9. Because of your problem, are you afraid to leave your home without having without having someone accompany you?  4 = Yes  E10. Because of your problem have you been embarrassed in front of others?  0 = No  P11. Do quick movements of your head increase your problem?  4 = Yes  F12. Because of your problem, do you avoid heights?  4 = Yes  P13. Does turning over in bed increase your problem?  4 = Yes  F14. Because of your problem, is it difficult for you to do strenuous homework or yard work? 4 = Yes  E15. Because of your problem, are you afraid people may think you are intoxicated? 0 = No  F16. Because of your problem, is it difficult for you to go for a walk by yourself?  4 = Yes  P17. Does walking down a  sidewalk increase your problem?  0 = No  E18.Because of your problem, is it difficult for you to concentrate 4 = Yes  F19. Because of your problem, is it difficult for you to walk around your house in the dark? 0 = No  E20. Because of your problem, are you afraid to stay home alone?  0 = No  E21. Because of your problem, do you feel handicapped? 2 = Sometimes  E22. Has the problem placed stress on your relationships with members of your family or friends? 0 = No  E23. Because of your problem, are you depressed?  0 = No  F24. Does your problem interfere with your job or household responsibilities?  0 = No  P25. Does bending over increase your problem?  4 = Yes  TOTAL 44    DHI Scoring Instructions  The patient is asked to answer  each question as it pertains to dizziness or unsteadiness problems, specifically  considering their condition during the last month. Questions are designed to incorporate functional (F), physical  (P), and emotional (E) impacts on disability.   Scores greater than 10 points should be referred to balance specialists for further evaluation.   16-34 Points (mild handicap)  36-52 Points (moderate handicap)  54+ Points (severe handicap)  Minimally Detectable Change: 17 points ([address] [place], 1990)  Casey, G. SHAUNNA. and Cementon, C. W. (1990). The development of the Dizziness Handicap Inventory. Archives of Otolaryngology - Head and Neck Surgery 116(4): [id number].   VESTIBULAR ASSESSMENT:  GENERAL OBSERVATION: No acute distress   SYMPTOM BEHAVIOR:  Subjective history: Pt reports waking up 9/17 and becoming instantly dizzy, very imbalanced trying to get to bathroom, sweaty and nauseated; with getting back to bed and sleeping, it subsided.  Got gradually slightly better, then worse over several days; went to Urgent Care and then to MD.  Reports rolling in bed brings it on.  Non-Vestibular symptoms: nausea/vomiting  Type of dizziness: Spinning/Vertigo and World  moves  Frequency: been going on since 9/17  Duration: seconds  Aggravating factors: Induced by position change: rolling to the right, rolling to the left, and supine to sit and Induced by motion: looking up at the ceiling and turning head quickly  Relieving factors: head stationary, closing eyes, and slow movements  Progression of symptoms: better  OCULOMOTOR EXAM: Hx of ptosis, wears bifocals, reports eye muscle doesn't fully work on L? To see eye doctor 11/3  Ocular Alignment: normal  Ocular ROM: No Limitations  Spontaneous Nystagmus: absent  Gaze-Induced Nystagmus: absent  Smooth Pursuits: intact  Saccades: intact and feels more restricted R>L  Convergence/Divergence: NT cm   Cover-cross-cover test: NT   VESTIBULAR - OCULAR REFLEX:   Slow VOR: Normal  VOR Cancellation: Comment: residual movement 4-5/10 symtpoms and settles  Head-Impulse Test: very guarded, unable to complete  Dynamic Visual Acuity: NT   POSITIONAL TESTING: Right Dix-Hallpike: no nystagmus and reports mild dizziness getting into this position for 10-20 seconds Left Dix-Hallpike: Performed x 1, no nystagmus, no dizziness; repeated with Loaded L DH, pt closes eyes due to dizziness going into position; no nystagmus noted by the time pt opened eyes.  Treated with Epley-see below Right Roll Test: no nystagmus Left Roll Test: no nystagmus  MOTION SENSITIVITY: NOT TESTED AT EVAL  Motion Sensitivity Quotient Intensity: 0 = none, 1 = Lightheaded, 2 = Mild, 3 = Moderate, 4 = Severe, 5 = Vomiting  Intensity  1. Sitting to supine   2. Supine to L side   3. Supine to R side   4. Supine to sitting   5. L Hallpike-Dix   6. Up from L    7. R Hallpike-Dix   8. Up from R    9. Sitting, head tipped to L knee   10. Head up from L knee   11. Sitting, head tipped to R knee   12. Head up from R knee   13. Sitting head turns x5   14.Sitting head nods x5   15. In stance, 180 turn to L    16. In stance, 180 turn to R  TREATMENT DATE: 09/23/2024   Canalith Repositioning:  Epley Left: Number of Reps: 1 and Response to Treatment: comment: pt reports mild, brief (<5 sec) dizziness in position 2; then no dizziness position 3; she reports unsteady/dizziness upon sitting up EOM.   PATIENT EDUCATION: Education details: Eval results, POC , rationale for treatment. Discussed holding off on her exercises (Epley/Brandt/Daroff that were given by MD) for now until we make sure we clear BPPV; discussed post-canal repositioning care Person educated: Patient and Spouse Education method: Explanation Education comprehension: verbalized understanding  HOME EXERCISE PROGRAM:  GOALS: Goals reviewed with patient? Yes  SHORT TERM GOALS: Target date: 10/02/2024  Pt will be independent with HEP for improved dizziness, balance. Baseline: Goal status: INITIAL   LONG TERM GOALS: Target date: 10/30/2024  Pt will be independent with HEP for improved dizziness, balance. Baseline:  Goal status: INITIAL  2.  DHI score to improve to 26, to demo decreased dizziness impacting ADLs. Baseline: 44 Goal status: INITIAL  3.  Pt will report 0/10 dizziness with bed mobility. Baseline:   M-CTSIB  Condition 1: Firm Surface, EO 30 Sec, Mild Sway  Condition 2: Firm Surface, EC 25 Sec, Mild, Moderate, and  Sway; pt opened eyes prematurely d/t fear  Condition 3: Foam Surface, EO 30 Sec, Mild and Moderate Sway  Condition 4: Foam Surface, EC 9 Sec, Moderate and Severe Sway   Goal status: IN PROGRESS  4.  MCTSIB Condition 2 and 4 for full 3 seconds, mild-mod sway for improved balance. Baseline: TBA Goal status: INITIAL  5.  Pt will improve DGI score to at least 20/24 to decrease fall risk. Baseline: 13/24 Goal status: IN PROGRESS  ASSESSMENT:  CLINICAL IMPRESSION: Patient arrived to session with  report of improved dizziness since last session. Patient score 13/24 on DGI, indicating an increased risk of falls. Dizziness was reported with head turns. Multisensory balance testing revealed imbalance with EC. Patient's R HIT was positive today. Positional testing was negative, however patient still with dizziness upon sitting up B.  Initiated HEP to address remaining symptoms. Patient tolerated session well and without complaints upon leaving.   OBJECTIVE IMPAIRMENTS: Abnormal gait, decreased balance, difficulty walking, and dizziness.   ACTIVITY LIMITATIONS: bending, bed mobility, and locomotion level  PARTICIPATION LIMITATIONS: meal prep, cleaning, laundry, community activity, yard work, and travel  PERSONAL FACTORS: 1-2 comorbidities: hx of dizziness x 10 years are also affecting patient's functional outcome.   REHAB POTENTIAL: Good  CLINICAL DECISION MAKING: Stable/uncomplicated  EVALUATION COMPLEXITY: Low   PLAN:  PT FREQUENCY: 1-2x/week  PT DURATION: 6 weeks including eval week  PLANNED INTERVENTIONS: 97750- Physical Performance Testing, 97110-Therapeutic exercises, 97530- Therapeutic activity, 97112- Neuromuscular re-education, 97535- Self Care, [id number]- Manual therapy, [phone]- Gait training, [phone]- Canalith repositioning, Patient/Family education, Balance training, and Vestibular training  PLAN FOR NEXT SESSION: review HEP; progress habituation and balance   Louana Terrilyn Christians, PT, DPT 09/24/24 4:20 PM  [facility] [address], [address] [place], [place] [address] Phone # [phone] Fax # [phone]

## 2024-09-23 NOTE — Therapy (Signed)
 OUTPATIENT PHYSICAL THERAPY VESTIBULAR EVALUATION     Patient Name: Summer Wood MRN: [id number] DOB:February 12, 1941, 83 y.o., female Today's Date: 09/23/2024  END OF SESSION:  PT End of Session - 09/23/24 1111     Visit Number 1    Number of Visits 12    Date for Recertification  10/30/24    Authorization Type [employer]-auth submitted    PT Start Time 1021    PT Stop Time 1102    PT Time Calculation (min) 41 min    Activity Tolerance Patient tolerated treatment well    Behavior During Therapy [facility] for tasks assessed/performed          History reviewed. No pertinent past medical history. Past Surgical History:  Procedure Laterality Date   CATARACT EXTRACTION     eye lift     Patient Active Problem List   Diagnosis Date Noted   Acute gastroenteritis 10/05/2022    PCP: Pinal Planas, MD REFERRING PROVIDER: Dayna Motto, DO   REFERRING DIAG: [phone] (ICD-10-CM) - Benign paroxysmal vertigo, left ear   THERAPY DIAG:  BPPV (benign paroxysmal positional vertigo), left  Dizziness and giddiness  Unsteadiness on feet  ONSET DATE: 09/07/2024 (MD referral)  Rationale for Evaluation and Treatment: Rehabilitation  SUBJECTIVE:   SUBJECTIVE STATEMENT: My first symptoms were over 10 years ago, but this is different.  On 9/17/125, when I woke up, I was so dizzy instantly, that I couldn't walk to bathroom without holding on.  Got into a cold sweat and nauseated.  It passed after falling back to sleep.  Gradually felt a little better for several days and then went to urgent care.  Wasn't really getting better, then went to MD for follow up visit.  He looked at vertigo and tried the Epley, couldn't finish due to me feeling like I might vomit.  I still have a dizziness, but not the same.  I can walk better.  Been working on exercises (?Brandt-Daroff/Epley) that the doctor gave me, at home. Pt accompanied by: significant other  PERTINENT HISTORY: Hx of dizziness since 2013,  worsening in past 6 weeks (saw MD with c/o 09/01/24 and 09/07/24)  PAIN:  Are you having pain? No  PRECAUTIONS: Fall  RED FLAGS: None   WEIGHT BEARING RESTRICTIONS: No  FALLS: Has patient fallen in last 6 months? No  LIVING ENVIRONMENT: Lives with: lives with their spouse Lives in: House/apartment  PLOF: Independent  PATIENT GOALS: To fully get rid of dizziness  OBJECTIVE:  Note: Objective measures were completed at Evaluation unless otherwise noted.  DIAGNOSTIC FINDINGS: NA for this episode  COGNITION: Overall cognitive status: Within functional limits for tasks assessed   SENSATION: Not tested  POSTURE:  rounded shoulders, forward head, and head slightly tilted toward R  Cervical ROM:    Active A/PROM (deg) eval  Flexion 25  Extension 10  Right lateral flexion   Left lateral flexion   Right rotation WFL  Left rotation WFL  (Blank rows = not tested)   BED MOBILITY:  Independent, slowed and guarded  TRANSFERS: Assistive device utilized: None  Sit to stand: Modified independence Stand to sit: Modified independence  GAIT: Gait pattern: guarded, unsteady, holds to husband's arm and step through pattern Distance walked: 50 ft Assistive device utilized: holds to husband's arm Level of assistance: CGA Comments: Reports walking is better after seeing MD and him performing the Epley manuever  PATIENT SURVEYS:  DHI: THE DIZZINESS HANDICAP INVENTORY (DHI)  P1. Does looking up increase your  problem? 0 = No  E2. Because of your problem, do you feel frustrated? 0 = No  F3. Because of your problem, do you restrict your travel for business or recreation?  2 = Sometimes  P4. Does walking down the aisle of a supermarket increase your problems?  4 = Yes  F5. Because of your problem, do you have difficulty getting into or out of bed?  2 = Sometimes  F6. Does your problem significantly restrict your participation in social activities, such as going out to dinner,  going to the movies, dancing, or going to parties? 2 = Sometimes  F7. Because of your problem, do you have difficulty reading?  0 = No  P8. Does performing more ambitious activities such as sports, dancing, household chores (sweeping or putting dishes away) increase your problems?  4 = Yes  E9. Because of your problem, are you afraid to leave your home without having without having someone accompany you?  4 = Yes  E10. Because of your problem have you been embarrassed in front of others?  0 = No  P11. Do quick movements of your head increase your problem?  4 = Yes  F12. Because of your problem, do you avoid heights?  4 = Yes  P13. Does turning over in bed increase your problem?  4 = Yes  F14. Because of your problem, is it difficult for you to do strenuous homework or yard work? 4 = Yes  E15. Because of your problem, are you afraid people may think you are intoxicated? 0 = No  F16. Because of your problem, is it difficult for you to go for a walk by yourself?  4 = Yes  P17. Does walking down a sidewalk increase your problem?  0 = No  E18.Because of your problem, is it difficult for you to concentrate 4 = Yes  F19. Because of your problem, is it difficult for you to walk around your house in the dark? 0 = No  E20. Because of your problem, are you afraid to stay home alone?  0 = No  E21. Because of your problem, do you feel handicapped? 2 = Sometimes  E22. Has the problem placed stress on your relationships with members of your family or friends? 0 = No  E23. Because of your problem, are you depressed?  0 = No  F24. Does your problem interfere with your job or household responsibilities?  0 = No  P25. Does bending over increase your problem?  4 = Yes  TOTAL 44    DHI Scoring Instructions  The patient is asked to answer each question as it pertains to dizziness or unsteadiness problems, specifically  considering their condition during the last month. Questions are designed to incorporate  functional (F), physical  (P), and emotional (E) impacts on disability.   Scores greater than 10 points should be referred to balance specialists for further evaluation.   16-34 Points (mild handicap)  36-52 Points (moderate handicap)  54+ Points (severe handicap)  Minimally Detectable Change: 17 points ([address] [place], 1990)  [facility] [facility], G. SHAUNNA. and [place], C. W. (1990). The development of the Dizziness Handicap Inventory. Archives of Otolaryngology - Head and Neck Surgery 116(4): [id number].   VESTIBULAR ASSESSMENT:  GENERAL OBSERVATION: No acute distress   SYMPTOM BEHAVIOR:  Subjective history: Pt reports waking up 9/17 and becoming instantly dizzy, very imbalanced trying to get to bathroom, sweaty and nauseated; with getting back to bed and sleeping, it subsided.  Got gradually  slightly better, then worse over several days; went to Urgent Care and then to MD.  Reports rolling in bed brings it on.  Non-Vestibular symptoms: nausea/vomiting  Type of dizziness: Spinning/Vertigo and World moves  Frequency: been going on since 9/17  Duration: seconds  Aggravating factors: Induced by position change: rolling to the right, rolling to the left, and supine to sit and Induced by motion: looking up at the ceiling and turning head quickly  Relieving factors: head stationary, closing eyes, and slow movements  Progression of symptoms: better  OCULOMOTOR EXAM: Hx of ptosis, wears bifocals, reports eye muscle doesn't fully work on L? To see eye doctor 11/3  Ocular Alignment: normal  Ocular ROM: No Limitations  Spontaneous Nystagmus: absent  Gaze-Induced Nystagmus: absent  Smooth Pursuits: intact  Saccades: intact and feels more restricted R>L  Convergence/Divergence: NT cm   Cover-cross-cover test: NT   VESTIBULAR - OCULAR REFLEX:   Slow VOR: Normal  VOR Cancellation: Comment: residual movement 4-5/10 symtpoms and settles  Head-Impulse Test: very guarded, unable to complete  Dynamic  Visual Acuity: NT   POSITIONAL TESTING: Right Dix-Hallpike: no nystagmus and reports mild dizziness getting into this position for 10-20 seconds Left Dix-Hallpike: Performed x 1, no nystagmus, no dizziness; repeated with Loaded L DH, pt closes eyes due to dizziness going into position; no nystagmus noted by the time pt opened eyes.  Treated with Epley-see below Right Roll Test: no nystagmus Left Roll Test: no nystagmus  MOTION SENSITIVITY: NOT TESTED AT EVAL  Motion Sensitivity Quotient Intensity: 0 = none, 1 = Lightheaded, 2 = Mild, 3 = Moderate, 4 = Severe, 5 = Vomiting  Intensity  1. Sitting to supine   2. Supine to L side   3. Supine to R side   4. Supine to sitting   5. L Hallpike-Dix   6. Up from L    7. R Hallpike-Dix   8. Up from R    9. Sitting, head tipped to L knee   10. Head up from L knee   11. Sitting, head tipped to R knee   12. Head up from R knee   13. Sitting head turns x5   14.Sitting head nods x5   15. In stance, 180 turn to L    16. In stance, 180 turn to R                                                                                                                                TREATMENT DATE: 09/23/2024   Canalith Repositioning:  Epley Left: Number of Reps: 1 and Response to Treatment: comment: pt reports mild, brief (<5 sec) dizziness in position 2; then no dizziness position 3; she reports unsteady/dizziness upon sitting up EOM.   PATIENT EDUCATION: Education details: Eval results, POC , rationale for treatment. Discussed holding off on her exercises (Epley/Brandt/Daroff that were given by MD) for now until we make sure we clear  BPPV; discussed post-canal repositioning care Person educated: Patient and Spouse Education method: Explanation Education comprehension: verbalized understanding  HOME EXERCISE PROGRAM:  GOALS: Goals reviewed with patient? Yes  SHORT TERM GOALS: Target date: 10/02/2024  Pt will be independent with HEP for improved  dizziness, balance. Baseline: Goal status: INITIAL   LONG TERM GOALS: Target date: 10/30/2024  Pt will be independent with HEP for improved dizziness, balance. Baseline:  Goal status: INITIAL  2.  DHI score to improve to 26, to demo decreased dizziness impacting ADLs. Baseline: 44 Goal status: INITIAL  3.  Pt will report 0/10 dizziness with bed mobility. Baseline:  Goal status: INITIAL  4.  MCTSIB Condition 2 and 4 for full 3 seconds, mild-mod sway for improved balance. Baseline: TBA Goal status: INITIAL  5.  Pt will improve DGI score to at least 20/24 to decrease fall risk. Baseline: TBA Goal status: INITIAL  ASSESSMENT:  CLINICAL IMPRESSION: Patient is a 83 y.o. female who was seen today for physical therapy evaluation and treatment for BPPV. She reports having general dizziness for at least 10 years, but a different dizziness with intense spinning, off balance, nausea/sweating, occurring upon getting up from bed on 9/17.  Since that time, this dizziness has varied in intensity and she has seen MD at urgent care then at her doctor's office.  Dr. Dayna looked at positional vertigo with [facility] and tried to treat with L Epley maneuver, based on her intense response to [facility].  Pt became nauseous and Epley was discontinued.  Pt does report since that time her walking balance is better and the dizziness with bed mobility has improved some (she does note she was given exercises for home-unsure if Epley or Brandt-Daroff).  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  No symptoms brought on by oculomotor testing.  Pt has some symptoms with horizontal VOR cancellation.  Attempted HIT, but pt very guarded at neck and unable to complete.  With positional testing, no nystagmus noted in any position; she reports some dizziness with R DH; she does close eyes due to dizziness with L DH position and nystagmus not noted upon pt opening her eyes.  Did complete L Epley, as L DH  seemed to bring on more symptoms.  She noted dizziness in position 2 of L Epley, so question if pt may have R posterior canal involvement as well.  Pt will benefit from skilled PT to further address dizziness as well as a more intentional look at balance, given her hx of dizziness and reports of imbalance with gait.  OBJECTIVE IMPAIRMENTS: Abnormal gait, decreased balance, difficulty walking, and dizziness.   ACTIVITY LIMITATIONS: bending, bed mobility, and locomotion level  PARTICIPATION LIMITATIONS: meal prep, cleaning, laundry, community activity, yard work, and travel  PERSONAL FACTORS: 1-2 comorbidities: hx of dizziness x 10 years are also affecting patient's functional outcome.   REHAB POTENTIAL: Good  CLINICAL DECISION MAKING: Stable/uncomplicated  EVALUATION COMPLEXITY: Low   PLAN:  PT FREQUENCY: 1-2x/week  PT DURATION: 6 weeks including eval week  PLANNED INTERVENTIONS: 97750- Physical Performance Testing, 97110-Therapeutic exercises, 97530- Therapeutic activity, [id number]- Neuromuscular re-education, 97535- Self Care, [id number]- Manual therapy, [phone]- Gait training, [phone]- Canalith repositioning, Patient/Family education, Balance training, and Vestibular training  PLAN FOR NEXT SESSION: Reassess for positional vertigo L (and R); treat BPPV as appropriate; try to check HIT again and also need to complete DGI and MCTSIB; initiate HEP    Shevelle Smither W., PT 09/23/2024, 11:13 AM  [facility] [address] [place], [place] [address] Phone # [phone] Fax # [phone]  Referring diagnosis? H81.12 (ICD-10-CM) - Benign paroxysmal vertigo, left ear  Treatment diagnosis? (if different than referring diagnosis) H81.12, R 42, R26.81 What was this (referring dx) caused by? []  Surgery []  Fall [x]  Ongoing issue []  Arthritis []  Other: ____________  Laterality: []  Rt [x]  Lt []  Both  Check all possible CPT  codes:  *CHOOSE 10 OR LESS*    See Planned Interventions listed in the Plan section of the Evaluation.

## 2024-09-24 ENCOUNTER — Ambulatory Visit: Admitting: Physical Therapy

## 2024-09-24 ENCOUNTER — Encounter: Payer: Self-pay | Admitting: Physical Therapy

## 2024-09-24 DIAGNOSIS — H8112 Benign paroxysmal vertigo, left ear: Secondary | ICD-10-CM | POA: Diagnosis not present

## 2024-09-24 DIAGNOSIS — R42 Dizziness and giddiness: Secondary | ICD-10-CM | POA: Diagnosis not present

## 2024-09-24 DIAGNOSIS — R2681 Unsteadiness on feet: Secondary | ICD-10-CM

## 2024-09-29 ENCOUNTER — Ambulatory Visit

## 2024-09-29 DIAGNOSIS — H8112 Benign paroxysmal vertigo, left ear: Secondary | ICD-10-CM | POA: Diagnosis not present

## 2024-09-29 DIAGNOSIS — R42 Dizziness and giddiness: Secondary | ICD-10-CM | POA: Diagnosis not present

## 2024-09-29 DIAGNOSIS — R2681 Unsteadiness on feet: Secondary | ICD-10-CM | POA: Diagnosis not present

## 2024-09-29 NOTE — Therapy (Signed)
 OUTPATIENT PHYSICAL THERAPY VESTIBULAR TREATMENT     Patient Name: Summer Wood MRN: [id number] DOB:1941-08-04, 83 y.o., female Today's Date: 09/29/2024  END OF SESSION:  PT End of Session - 09/29/24 0845     Visit Number 3    Number of Visits 12    Date for Recertification  10/30/24    Authorization Type [employer]-auth submitted    PT Start Time 0845    PT Stop Time 0930    PT Time Calculation (min) 45 min    Equipment Utilized During Treatment Gait belt    Activity Tolerance Patient tolerated treatment well    Behavior During Therapy WFL for tasks assessed/performed           No past medical history on file. Past Surgical History:  Procedure Laterality Date   CATARACT EXTRACTION     eye lift     Patient Active Problem List   Diagnosis Date Noted   Acute gastroenteritis 10/05/2022    PCP: Pinal Planas, MD REFERRING PROVIDER: Dayna Motto, DO   REFERRING DIAG: [phone] (ICD-10-CM) - Benign paroxysmal vertigo, left ear   THERAPY DIAG:  BPPV (benign paroxysmal positional vertigo), left  Dizziness and giddiness  Unsteadiness on feet  ONSET DATE: 09/07/2024 (MD referral)  Rationale for Evaluation and Treatment: Rehabilitation  SUBJECTIVE:   SUBJECTIVE STATEMENT: Sunday was terrible with strong positional aspect to it.  Yesterday felt like something shot up the left side of head, spent the night on the right side and woke up this AM feeling much improved  Pt accompanied by: significant other  PERTINENT HISTORY: Hx of dizziness since 2013, worsening in past 6 weeks (saw MD with c/o 09/01/24 and 09/07/24)  PAIN:  Are you having pain? No  PRECAUTIONS: Fall  RED FLAGS: None   WEIGHT BEARING RESTRICTIONS: No  FALLS: Has patient fallen in last 6 months? No  LIVING ENVIRONMENT: Lives with: lives with their spouse Lives in: House/apartment  PLOF: Independent  PATIENT GOALS: To fully get rid of dizziness  OBJECTIVE:    TODAY'S TREATMENT:  09/29/24 Activity Comments  Right roll test No dizziness/nystagmus  Left roll test No dizziness/nystagmus  Loaded Dix-Hallpike No dizziness/nystagmus  Corner balance For HEP progression  VOR x 1 (sitting)--no issues VOR x 1 (standing)  X 30 sec ea direction  Backwards walking W/ and without head turns     TODAY'S TREATMENT: 09/24/24   M-CTSIB  Condition 1: Firm Surface, EO 30 Sec, Mild Sway  Condition 2: Firm Surface, EC 25 Sec, Mild, Moderate, and   Sway; pt opened eyes prematurely d/t fear  Condition 3: Foam Surface, EO 30 Sec, Mild and Moderate Sway  Condition 4: Foam Surface, EC 9 Sec, Moderate and Severe Sway     Activity Comments  DGI 13/24  HIT Positive on R side, negative on L; pt very guarded  L DH modified over pillow  Negative; dizziness and imbalance upon sitting up  R DH modified over pillow  Negative; dizziness and imbalance upon sitting up  R/L brandt daroff C/o some dizziness L>R  Sitting head turns to targets 30 Accommodated for limited ROM d/t neck stiffness    HOME EXERCISE PROGRAM Last updated: 09/24/24 Access Code: [email or web address] URL: [email or web address] Date: 09/24/2024 Prepared by: [facility] - Outpatient  Rehab - [facility]  Program Notes perform with husband present for safety.  Exercises - Brandt-Daroff Vestibular Exercise  - 1 x daily - 5 x weekly - 2 sets - 3-5 reps - Seated Left Head  Turns Vestibular Habituation  - 1 x daily - 5 x weekly - 2 sets - 30 sec hold - Corner Balance Feet Together With Eyes Open  - 1 x daily - 7 x weekly - 3 sets - 30 sec hold - Corner Balance Feet Together With Eyes Closed  - 1 x daily - 7 x weekly - 3 sets - 30 sec hold - Corner Balance Feet Together: Eyes Open With Head Turns  - 1 x daily - 7 x weekly - 3 sets - 3 reps - Corner Balance Feet Together: Eyes Closed With Head Turns  - 1 x daily - 7 x weekly - 3 sets - 3 reps - Walking Backward with Head Rotation  - 1 x daily - 7 x weekly - 1-3 sets  - 1-2 min rounds hold   PATIENT EDUCATION: Education details: exam findings and falls risk, advised pt to discontinue Epley at home at this time; HEP Person educated: Patient Education method: Explanation, Demonstration, Tactile cues, Verbal cues, and Handouts Education comprehension: verbalized understanding and returned demonstration    Note: Objective measures were completed at Evaluation unless otherwise noted.  DIAGNOSTIC FINDINGS: NA for this episode  COGNITION: Overall cognitive status: Within functional limits for tasks assessed   SENSATION: Not tested  POSTURE:  rounded shoulders, forward head, and head slightly tilted toward R  Cervical ROM:    Active A/PROM (deg) eval  Flexion 25  Extension 10  Right lateral flexion   Left lateral flexion   Right rotation WFL  Left rotation WFL  (Blank rows = not tested)   BED MOBILITY:  Independent, slowed and guarded  TRANSFERS: Assistive device utilized: None  Sit to stand: Modified independence Stand to sit: Modified independence  GAIT: Gait pattern: guarded, unsteady, holds to husband's arm and step through pattern Distance walked: 50 ft Assistive device utilized: holds to husband's arm Level of assistance: CGA Comments: Reports walking is better after seeing MD and him performing the Epley manuever  PATIENT SURVEYS:  DHI: THE DIZZINESS HANDICAP INVENTORY (DHI)  P1. Does looking up increase your problem? 0 = No  E2. Because of your problem, do you feel frustrated? 0 = No  F3. Because of your problem, do you restrict your travel for business or recreation?  2 = Sometimes  P4. Does walking down the aisle of a supermarket increase your problems?  4 = Yes  F5. Because of your problem, do you have difficulty getting into or out of bed?  2 = Sometimes  F6. Does your problem significantly restrict your participation in social activities, such as going out to dinner, going to the movies, dancing, or going to  parties? 2 = Sometimes  F7. Because of your problem, do you have difficulty reading?  0 = No  P8. Does performing more ambitious activities such as sports, dancing, household chores (sweeping or putting dishes away) increase your problems?  4 = Yes  E9. Because of your problem, are you afraid to leave your home without having without having someone accompany you?  4 = Yes  E10. Because of your problem have you been embarrassed in front of others?  0 = No  P11. Do quick movements of your head increase your problem?  4 = Yes  F12. Because of your problem, do you avoid heights?  4 = Yes  P13. Does turning over in bed increase your problem?  4 = Yes  F14. Because of your problem, is it difficult for you to do  strenuous homework or yard work? 4 = Yes  E15. Because of your problem, are you afraid people may think you are intoxicated? 0 = No  F16. Because of your problem, is it difficult for you to go for a walk by yourself?  4 = Yes  P17. Does walking down a sidewalk increase your problem?  0 = No  E18.Because of your problem, is it difficult for you to concentrate 4 = Yes  F19. Because of your problem, is it difficult for you to walk around your house in the dark? 0 = No  E20. Because of your problem, are you afraid to stay home alone?  0 = No  E21. Because of your problem, do you feel handicapped? 2 = Sometimes  E22. Has the problem placed stress on your relationships with members of your family or friends? 0 = No  E23. Because of your problem, are you depressed?  0 = No  F24. Does your problem interfere with your job or household responsibilities?  0 = No  P25. Does bending over increase your problem?  4 = Yes  TOTAL 44    DHI Scoring Instructions  The patient is asked to answer each question as it pertains to dizziness or unsteadiness problems, specifically  considering their condition during the last month. Questions are designed to incorporate functional (F), physical  (P), and emotional  (E) impacts on disability.   Scores greater than 10 points should be referred to balance specialists for further evaluation.   16-34 Points (mild handicap)  36-52 Points (moderate handicap)  54+ Points (severe handicap)  Minimally Detectable Change: 17 points ([address] [place] [facility] [place], 1990)  [place], G. SHAUNNA. and [place], C. W. (1990). The development of the Dizziness Handicap Inventory. Archives of Otolaryngology - Head and Neck Surgery 116(4): [id number].   VESTIBULAR ASSESSMENT:  GENERAL OBSERVATION: No acute distress   SYMPTOM BEHAVIOR:  Subjective history: Pt reports waking up 9/17 and becoming instantly dizzy, very imbalanced trying to get to bathroom, sweaty and nauseated; with getting back to bed and sleeping, it subsided.  Got gradually slightly better, then worse over several days; went to Urgent Care and then to MD.  Reports rolling in bed brings it on.  Non-Vestibular symptoms: nausea/vomiting  Type of dizziness: Spinning/Vertigo and World moves  Frequency: been going on since 9/17  Duration: seconds  Aggravating factors: Induced by position change: rolling to the right, rolling to the left, and supine to sit and Induced by motion: looking up at the ceiling and turning head quickly  Relieving factors: head stationary, closing eyes, and slow movements  Progression of symptoms: better  OCULOMOTOR EXAM: Hx of ptosis, wears bifocals, reports eye muscle doesn't fully work on L? To see eye doctor 11/3  Ocular Alignment: normal  Ocular ROM: No Limitations  Spontaneous Nystagmus: absent  Gaze-Induced Nystagmus: absent  Smooth Pursuits: intact  Saccades: intact and feels more restricted R>L  Convergence/Divergence: NT cm   Cover-cross-cover test: NT   VESTIBULAR - OCULAR REFLEX:   Slow VOR: Normal  VOR Cancellation: Comment: residual movement 4-5/10 symtpoms and settles  Head-Impulse Test: very guarded, unable to complete  Dynamic Visual Acuity: NT   POSITIONAL TESTING: Right  Dix-Hallpike: no nystagmus and reports mild dizziness getting into this position for 10-20 seconds Left Dix-Hallpike: Performed x 1, no nystagmus, no dizziness; repeated with Loaded L DH, pt closes eyes due to dizziness going into position; no nystagmus noted by the time pt opened eyes.  Treated with Epley-see below  Right Roll Test: no nystagmus Left Roll Test: no nystagmus  MOTION SENSITIVITY: NOT TESTED AT EVAL  Motion Sensitivity Quotient Intensity: 0 = none, 1 = Lightheaded, 2 = Mild, 3 = Moderate, 4 = Severe, 5 = Vomiting  Intensity  1. Sitting to supine   2. Supine to L side   3. Supine to R side   4. Supine to sitting   5. L Hallpike-Dix   6. Up from L    7. R Hallpike-Dix   8. Up from R    9. Sitting, head tipped to L knee   10. Head up from L knee   11. Sitting, head tipped to R knee   12. Head up from R knee   13. Sitting head turns x5   14.Sitting head nods x5   15. In stance, 180 turn to L    16. In stance, 180 turn to R                                                                                                                                TREATMENT DATE: 09/23/2024   Canalith Repositioning:  Epley Left: Number of Reps: 1 and Response to Treatment: comment: pt reports mild, brief (<5 sec) dizziness in position 2; then no dizziness position 3; she reports unsteady/dizziness upon sitting up EOM.   PATIENT EDUCATION: Education details: Eval results, POC , rationale for treatment. Discussed holding off on her exercises (Epley/Brandt/Daroff that were given by MD) for now until we make sure we clear BPPV; discussed post-canal repositioning care Person educated: Patient and Spouse Education method: Explanation Education comprehension: verbalized understanding  HOME EXERCISE PROGRAM:  GOALS: Goals reviewed with patient? Yes  SHORT TERM GOALS: Target date: 10/02/2024  Pt will be independent with HEP for improved dizziness, balance. Baseline: Goal status:  INITIAL   LONG TERM GOALS: Target date: 10/30/2024  Pt will be independent with HEP for improved dizziness, balance. Baseline:  Goal status: INITIAL  2.  DHI score to improve to 26, to demo decreased dizziness impacting ADLs. Baseline: 44 Goal status: INITIAL  3.  Pt will report 0/10 dizziness with bed mobility. Baseline:   M-CTSIB  Condition 1: Firm Surface, EO 30 Sec, Mild Sway  Condition 2: Firm Surface, EC 25 Sec, Mild, Moderate, and  Sway; pt opened eyes prematurely d/t fear  Condition 3: Foam Surface, EO 30 Sec, Mild and Moderate Sway  Condition 4: Foam Surface, EC 9 Sec, Moderate and Severe Sway   Goal status: IN PROGRESS  4.  MCTSIB Condition 2 and 4 for full 3 seconds, mild-mod sway for improved balance. Baseline: TBA Goal status: INITIAL  5.  Pt will improve DGI score to at least 20/24 to decrease fall risk. Baseline: 13/24 Goal status: IN PROGRESS  ASSESSMENT:  CLINICAL IMPRESSION: Positional tests unprovocative for dizziness/nystagmus and minimal discomfort to change of position from tests.  Pt instructed in static multisensory balance for improved postural/proprioceptive  awareness/control with difficulty under eyes closed and head movement condtions reuqiring UE support.  VOR x 1 without provocation of symptoms. Dynamic balance of backwards walking and incorporating head movements for vestibular stimulation and single limb support. Pt reports feeling markedly better at this point.    OBJECTIVE IMPAIRMENTS: Abnormal gait, decreased balance, difficulty walking, and dizziness.   ACTIVITY LIMITATIONS: bending, bed mobility, and locomotion level  PARTICIPATION LIMITATIONS: meal prep, cleaning, laundry, community activity, yard work, and travel  PERSONAL FACTORS: 1-2 comorbidities: hx of dizziness x 10 years are also affecting patient's functional outcome.   REHAB POTENTIAL: Good  CLINICAL DECISION MAKING: Stable/uncomplicated  EVALUATION COMPLEXITY:  Low   PLAN:  PT FREQUENCY: 1-2x/week  PT DURATION: 6 weeks including eval week  PLANNED INTERVENTIONS: 97750- Physical Performance Testing, 97110-Therapeutic exercises, 97530- Therapeutic activity, [id number]- Neuromuscular re-education, 97535- Self Care, [id number]- Manual therapy, [id number]- Gait training, [phone]- Canalith repositioning, Patient/Family education, Balance training, and Vestibular training  PLAN FOR NEXT SESSION: HEP review, progressions vs D/C-hold?   9:30 AM, 09/29/24 M. Kelly Selestino Nila, PT, DPT Physical Therapist- [place] Office Number: [phone]

## 2024-09-30 NOTE — Therapy (Signed)
 OUTPATIENT PHYSICAL THERAPY VESTIBULAR TREATMENT     Patient Name: Summer Wood MRN: [id number] DOB:January 09, 1941, 83 y.o., female Today's Date: 09/30/2024  END OF SESSION:     No past medical history on file. Past Surgical History:  Procedure Laterality Date   CATARACT EXTRACTION     eye lift     Patient Active Problem List   Diagnosis Date Noted   Acute gastroenteritis 10/05/2022    PCP: Pinal Planas, MD REFERRING PROVIDER: Dayna Motto, DO   REFERRING DIAG: [phone] (ICD-10-CM) - Benign paroxysmal vertigo, left ear   THERAPY DIAG:  No diagnosis found.  ONSET DATE: 09/07/2024 (MD referral)  Rationale for Evaluation and Treatment: Rehabilitation  SUBJECTIVE:   SUBJECTIVE STATEMENT: Sunday was terrible with strong positional aspect to it.  Yesterday felt like something shot up the left side of head, spent the night on the right side and woke up this AM feeling much improved  Pt accompanied by: significant other  PERTINENT HISTORY: Hx of dizziness since 2013, worsening in past 6 weeks (saw MD with c/o 09/01/24 and 09/07/24)  PAIN:  Are you having pain? No  PRECAUTIONS: Fall  RED FLAGS: None   WEIGHT BEARING RESTRICTIONS: No  FALLS: Has patient fallen in last 6 months? No  LIVING ENVIRONMENT: Lives with: lives with their spouse Lives in: House/apartment  PLOF: Independent  PATIENT GOALS: To fully get rid of dizziness  OBJECTIVE:     TODAY'S TREATMENT: 10/01/24 Activity Comments                           TODAY'S TREATMENT: 09/29/24 Activity Comments  Right roll test No dizziness/nystagmus  Left roll test No dizziness/nystagmus  Loaded Dix-Hallpike No dizziness/nystagmus  Corner balance For HEP progression  VOR x 1 (sitting)--no issues VOR x 1 (standing)  X 30 sec ea direction  Backwards walking W/ and without head turns     TODAY'S TREATMENT: 09/24/24   M-CTSIB  Condition 1: Firm Surface, EO 30 Sec, Mild Sway  Condition  2: Firm Surface, EC 25 Sec, Mild, Moderate, and   Sway; pt opened eyes prematurely d/t fear  Condition 3: Foam Surface, EO 30 Sec, Mild and Moderate Sway  Condition 4: Foam Surface, EC 9 Sec, Moderate and Severe Sway     Activity Comments  DGI 13/24  HIT Positive on R side, negative on L; pt very guarded  L DH modified over pillow  Negative; dizziness and imbalance upon sitting up  R DH modified over pillow  Negative; dizziness and imbalance upon sitting up  R/L brandt daroff C/o some dizziness L>R  Sitting head turns to targets 30 Accommodated for limited ROM d/t neck stiffness    HOME EXERCISE PROGRAM Last updated: 09/24/24 Access Code: [email or web address] URL: [email or web address] Date: 09/24/2024 Prepared by: [facility] - Outpatient  Rehab - [facility]  Program Notes perform with husband present for safety.  Exercises - Brandt-Daroff Vestibular Exercise  - 1 x daily - 5 x weekly - 2 sets - 3-5 reps - Seated Left Head Turns Vestibular Habituation  - 1 x daily - 5 x weekly - 2 sets - 30 sec hold - Corner Balance Feet Together With Eyes Open  - 1 x daily - 7 x weekly - 3 sets - 30 sec hold - Corner Balance Feet Together With Eyes Closed  - 1 x daily - 7 x weekly - 3 sets - 30 sec hold - Corner Balance Feet Together: Eyes Open  With Head Turns  - 1 x daily - 7 x weekly - 3 sets - 3 reps - Corner Balance Feet Together: Eyes Closed With Head Turns  - 1 x daily - 7 x weekly - 3 sets - 3 reps - Walking Backward with Head Rotation  - 1 x daily - 7 x weekly - 1-3 sets - 1-2 min rounds hold   PATIENT EDUCATION: Education details: exam findings and falls risk, advised pt to discontinue Epley at home at this time; HEP Person educated: Patient Education method: Explanation, Demonstration, Tactile cues, Verbal cues, and Handouts Education comprehension: verbalized understanding and returned demonstration    Note: Objective measures were completed at Evaluation unless  otherwise noted.  DIAGNOSTIC FINDINGS: NA for this episode  COGNITION: Overall cognitive status: Within functional limits for tasks assessed   SENSATION: Not tested  POSTURE:  rounded shoulders, forward head, and head slightly tilted toward R  Cervical ROM:    Active A/PROM (deg) eval  Flexion 25  Extension 10  Right lateral flexion   Left lateral flexion   Right rotation WFL  Left rotation WFL  (Blank rows = not tested)   BED MOBILITY:  Independent, slowed and guarded  TRANSFERS: Assistive device utilized: None  Sit to stand: Modified independence Stand to sit: Modified independence  GAIT: Gait pattern: guarded, unsteady, holds to husband's arm and step through pattern Distance walked: 50 ft Assistive device utilized: holds to husband's arm Level of assistance: CGA Comments: Reports walking is better after seeing MD and him performing the Epley manuever  PATIENT SURVEYS:  DHI: THE DIZZINESS HANDICAP INVENTORY (DHI)  P1. Does looking up increase your problem? 0 = No  E2. Because of your problem, do you feel frustrated? 0 = No  F3. Because of your problem, do you restrict your travel for business or recreation?  2 = Sometimes  P4. Does walking down the aisle of a supermarket increase your problems?  4 = Yes  F5. Because of your problem, do you have difficulty getting into or out of bed?  2 = Sometimes  F6. Does your problem significantly restrict your participation in social activities, such as going out to dinner, going to the movies, dancing, or going to parties? 2 = Sometimes  F7. Because of your problem, do you have difficulty reading?  0 = No  P8. Does performing more ambitious activities such as sports, dancing, household chores (sweeping or putting dishes away) increase your problems?  4 = Yes  E9. Because of your problem, are you afraid to leave your home without having without having someone accompany you?  4 = Yes  E10. Because of your problem have you  been embarrassed in front of others?  0 = No  P11. Do quick movements of your head increase your problem?  4 = Yes  F12. Because of your problem, do you avoid heights?  4 = Yes  P13. Does turning over in bed increase your problem?  4 = Yes  F14. Because of your problem, is it difficult for you to do strenuous homework or yard work? 4 = Yes  E15. Because of your problem, are you afraid people may think you are intoxicated? 0 = No  F16. Because of your problem, is it difficult for you to go for a walk by yourself?  4 = Yes  P17. Does walking down a sidewalk increase your problem?  0 = No  E18.Because of your problem, is it difficult for you to concentrate  4 = Yes  F19. Because of your problem, is it difficult for you to walk around your house in the dark? 0 = No  E20. Because of your problem, are you afraid to stay home alone?  0 = No  E21. Because of your problem, do you feel handicapped? 2 = Sometimes  E22. Has the problem placed stress on your relationships with members of your family or friends? 0 = No  E23. Because of your problem, are you depressed?  0 = No  F24. Does your problem interfere with your job or household responsibilities?  0 = No  P25. Does bending over increase your problem?  4 = Yes  TOTAL 44    DHI Scoring Instructions  The patient is asked to answer each question as it pertains to dizziness or unsteadiness problems, specifically  considering their condition during the last month. Questions are designed to incorporate functional (F), physical  (P), and emotional (E) impacts on disability.   Scores greater than 10 points should be referred to balance specialists for further evaluation.   16-34 Points (mild handicap)  36-52 Points (moderate handicap)  54+ Points (severe handicap)  Minimally Detectable Change: 17 points ([address] [place], 1990)  Huetter, G. SHAUNNA. and [place], C. W. (1990). The development of the Dizziness Handicap Inventory. Archives of Otolaryngology  - Head and Neck Surgery 116(4): [id number].   VESTIBULAR ASSESSMENT:  GENERAL OBSERVATION: No acute distress   SYMPTOM BEHAVIOR:  Subjective history: Pt reports waking up 9/17 and becoming instantly dizzy, very imbalanced trying to get to bathroom, sweaty and nauseated; with getting back to bed and sleeping, it subsided.  Got gradually slightly better, then worse over several days; went to Urgent Care and then to MD.  Reports rolling in bed brings it on.  Non-Vestibular symptoms: nausea/vomiting  Type of dizziness: Spinning/Vertigo and World moves  Frequency: been going on since 9/17  Duration: seconds  Aggravating factors: Induced by position change: rolling to the right, rolling to the left, and supine to sit and Induced by motion: looking up at the ceiling and turning head quickly  Relieving factors: head stationary, closing eyes, and slow movements  Progression of symptoms: better  OCULOMOTOR EXAM: Hx of ptosis, wears bifocals, reports eye muscle doesn't fully work on L? To see eye doctor 11/3  Ocular Alignment: normal  Ocular ROM: No Limitations  Spontaneous Nystagmus: absent  Gaze-Induced Nystagmus: absent  Smooth Pursuits: intact  Saccades: intact and feels more restricted R>L  Convergence/Divergence: NT cm   Cover-cross-cover test: NT   VESTIBULAR - OCULAR REFLEX:   Slow VOR: Normal  VOR Cancellation: Comment: residual movement 4-5/10 symtpoms and settles  Head-Impulse Test: very guarded, unable to complete  Dynamic Visual Acuity: NT   POSITIONAL TESTING: Right Dix-Hallpike: no nystagmus and reports mild dizziness getting into this position for 10-20 seconds Left Dix-Hallpike: Performed x 1, no nystagmus, no dizziness; repeated with Loaded L DH, pt closes eyes due to dizziness going into position; no nystagmus noted by the time pt opened eyes.  Treated with Epley-see below Right Roll Test: no nystagmus Left Roll Test: no nystagmus  MOTION SENSITIVITY: NOT TESTED AT  EVAL  Motion Sensitivity Quotient Intensity: 0 = none, 1 = Lightheaded, 2 = Mild, 3 = Moderate, 4 = Severe, 5 = Vomiting  Intensity  1. Sitting to supine   2. Supine to L side   3. Supine to R side   4. Supine to sitting   5. L Hallpike-Dix  6. Up from L    7. R Hallpike-Dix   8. Up from R    9. Sitting, head tipped to L knee   10. Head up from L knee   11. Sitting, head tipped to R knee   12. Head up from R knee   13. Sitting head turns x5   14.Sitting head nods x5   15. In stance, 180 turn to L    16. In stance, 180 turn to R                                                                                                                                TREATMENT DATE: 09/23/2024   Canalith Repositioning:  Epley Left: Number of Reps: 1 and Response to Treatment: comment: pt reports mild, brief (<5 sec) dizziness in position 2; then no dizziness position 3; she reports unsteady/dizziness upon sitting up EOM.   PATIENT EDUCATION: Education details: Eval results, POC , rationale for treatment. Discussed holding off on her exercises (Epley/Brandt/Daroff that were given by MD) for now until we make sure we clear BPPV; discussed post-canal repositioning care Person educated: Patient and Spouse Education method: Explanation Education comprehension: verbalized understanding  HOME EXERCISE PROGRAM:  GOALS: Goals reviewed with patient? Yes  SHORT TERM GOALS: Target date: 10/02/2024  Pt will be independent with HEP for improved dizziness, balance. Baseline: Goal status: INITIAL   LONG TERM GOALS: Target date: 10/30/2024  Pt will be independent with HEP for improved dizziness, balance. Baseline:  Goal status: INITIAL  2.  DHI score to improve to 26, to demo decreased dizziness impacting ADLs. Baseline: 44 Goal status: INITIAL  3.  Pt will report 0/10 dizziness with bed mobility. Baseline:  Goal status: IN PROGRESS  4.  MCTSIB Condition 2 and 4 for full 3 seconds,  mild-mod sway for improved balance. Baseline: 9 Sec, Moderate and Severe Sway;  Goal status: INITIAL  5.  Pt will improve DGI score to at least 20/24 to decrease fall risk. Baseline: 13/24 Goal status: IN PROGRESS  ASSESSMENT:  CLINICAL IMPRESSION: Positional tests unprovocative for dizziness/nystagmus and minimal discomfort to change of position from tests.  Pt instructed in static multisensory balance for improved postural/proprioceptive awareness/control with difficulty under eyes closed and head movement condtions reuqiring UE support.  VOR x 1 without provocation of symptoms. Dynamic balance of backwards walking and incorporating head movements for vestibular stimulation and single limb support. Pt reports feeling markedly better at this point.    OBJECTIVE IMPAIRMENTS: Abnormal gait, decreased balance, difficulty walking, and dizziness.   ACTIVITY LIMITATIONS: bending, bed mobility, and locomotion level  PARTICIPATION LIMITATIONS: meal prep, cleaning, laundry, community activity, yard work, and travel  PERSONAL FACTORS: 1-2 comorbidities: hx of dizziness x 10 years are also affecting patient's functional outcome.   REHAB POTENTIAL: Good  CLINICAL DECISION MAKING: Stable/uncomplicated  EVALUATION COMPLEXITY: Low   PLAN:  PT FREQUENCY: 1-2x/week  PT DURATION: 6 weeks including  eval week  PLANNED INTERVENTIONS: 97750- Physical Performance Testing, 97110-Therapeutic exercises, 97530- Therapeutic activity, [id number]- Neuromuscular re-education, 97535- Self Care, [id number]- Manual therapy, [phone]- Gait training, [phone]- Canalith repositioning, Patient/Family education, Balance training, and Vestibular training  PLAN FOR NEXT SESSION: HEP review, progressions vs D/C-hold?

## 2024-10-01 ENCOUNTER — Ambulatory Visit: Admitting: Physical Therapy

## 2024-10-01 ENCOUNTER — Encounter: Payer: Self-pay | Admitting: Physical Therapy

## 2024-10-01 DIAGNOSIS — H8112 Benign paroxysmal vertigo, left ear: Secondary | ICD-10-CM | POA: Diagnosis not present

## 2024-10-01 DIAGNOSIS — R2681 Unsteadiness on feet: Secondary | ICD-10-CM | POA: Diagnosis not present

## 2024-10-01 DIAGNOSIS — R42 Dizziness and giddiness: Secondary | ICD-10-CM

## 2024-10-06 DIAGNOSIS — H8110 Benign paroxysmal vertigo, unspecified ear: Secondary | ICD-10-CM | POA: Diagnosis not present

## 2024-10-07 ENCOUNTER — Encounter: Payer: Self-pay | Admitting: Physical Therapy

## 2024-10-07 ENCOUNTER — Ambulatory Visit: Attending: Family Medicine | Admitting: Physical Therapy

## 2024-10-07 DIAGNOSIS — R42 Dizziness and giddiness: Secondary | ICD-10-CM | POA: Insufficient documentation

## 2024-10-07 DIAGNOSIS — R2681 Unsteadiness on feet: Secondary | ICD-10-CM | POA: Diagnosis not present

## 2024-10-07 DIAGNOSIS — H8112 Benign paroxysmal vertigo, left ear: Secondary | ICD-10-CM | POA: Diagnosis not present

## 2024-10-07 NOTE — Therapy (Signed)
 OUTPATIENT PHYSICAL THERAPY VESTIBULAR TREATMENT NOTE     Patient Name: Summer Wood MRN: [id number] DOB:[birth year], 83 y.o., female Today's Date: 10/07/2024  END OF SESSION:  PT End of Session - 10/07/24 1105     Visit Number 5    Number of Visits 8    Date for Recertification  11/05/24    Authorization Type [employer]-auth submitted    Authorization Time Period 12 visits 09/23/24-10/27/24    Authorization - Visit Number 5    Authorization - Number of Visits 12    PT Start Time 1106    PT Stop Time 1140   leaves early for dental appt   PT Time Calculation (min) 34 min    Equipment Utilized During Treatment --    Activity Tolerance Patient tolerated treatment well    Behavior During Therapy [facility] for tasks assessed/performed             History reviewed. No pertinent past medical history. Past Surgical History:  Procedure Laterality Date   CATARACT EXTRACTION     eye lift     Patient Active Problem List   Diagnosis Date Noted   Acute gastroenteritis 10/05/2022    PCP: Pinal Planas, MD REFERRING PROVIDER: Dayna Motto, DO   REFERRING DIAG: [phone] (ICD-10-CM) - Benign paroxysmal vertigo, left ear   THERAPY DIAG:  BPPV (benign paroxysmal positional vertigo), left  Dizziness and giddiness  Unsteadiness on feet  ONSET DATE: 09/07/2024 (MD referral)  Rationale for Evaluation and Treatment: Rehabilitation  SUBJECTIVE:   SUBJECTIVE STATEMENT: The main dizziness is gone, but sometimes at night rolling side to side brings it on.  Have a dentist appointment at noon, because sometimes feel the pain radiating from the tooth.     Pt accompanied by: self  PERTINENT HISTORY: Hx of dizziness since 2013, worsening in past 6 weeks (saw MD with c/o 09/01/24 and 09/07/24)  PAIN:  Are you having pain? No  PRECAUTIONS: Fall  RED FLAGS: None   WEIGHT BEARING RESTRICTIONS: No  FALLS: Has patient fallen in last 6 months? No  LIVING ENVIRONMENT: Lives  with: lives with their spouse Lives in: House/apartment  PLOF: Independent  PATIENT GOALS: To fully get rid of dizziness  OBJECTIVE:  MOTION SENSITIVITY:   Motion Sensitivity Quotient Intensity: 0 = none, 1 = Lightheaded, 2 = Mild, 3 = Moderate, 4 = Severe, 5 = Vomiting  Intensity  1. Sitting to supine 0-1  2. Supine to L side 0 (return to supine 1)  3. Supine to R side 0 (return to supine 1)  4. Supine to sitting 4  5. L Hallpike-Dix   6. Up from L    7. R Hallpike-Dix   8. Up from R    9. Sitting, head tipped to L knee 1  10. Head up from L knee 0  11. Sitting, head tipped to R knee 3  12. Head up from R knee 3  13. Sitting head turns x5 2  14.Sitting head nods x5 2  15. In stance, 180 turn to L  1  16. In stance, 180 turn to R 1    TODAY'S TREATMENT: 10/07/2024 Activity Comments  Habituation roll to R R sidelying to center Repeated x 3 additional reps 0/10 dizziness 2/10 (settles <10 sec) Less dizziness (0/10)  Habituation roll to L L sidelying to center Repeated x 3 additional reps 0/10 dizziness 3/10 (settles <10 sec) 1/10  L Brandt-Daroff, repeated 3 reps Initial 4/10, 1/10  Seated nose to  R knee, 5 reps Feels full in her head  Reviewed corner balance exercises Good form, mild sway       Access Code: [email or web address] URL: [email or web address] Date: 10/07/2024 Prepared by: [facility] - Outpatient  Rehab - [facility]  Program Notes perform with husband present for safety.  Exercises - Corner Balance Feet Together With Eyes Open  - 1 x daily - 7 x weekly - 3 sets - 30 sec hold - Corner Balance Feet Together With Eyes Closed  - 1 x daily - 7 x weekly - 3 sets - 30 sec hold - Corner Balance Feet Together: Eyes Open With Head Turns  - 1 x daily - 7 x weekly - 3 sets - 3 reps - Corner Balance Feet Together: Eyes Closed With Head Turns  - 1 x daily - 7 x weekly - 3 sets - 3 reps - Walking Backward with Head Rotation  - 1 x daily - 7 x weekly -  1-3 sets - 1-2 min rounds hold - Supine to Right Sidelying Vestibular Habituation  - 1-2 x daily - 7 x weekly - 1 sets - 3-5 reps - Supine to Left Sidelying Vestibular Habituation  - 1-2 x daily - 7 x weekly - 1 sets - 3-5 reps - Brandt-Daroff Vestibular Exercise  - 1-2 x daily - 7 x weekly - 1 sets - 3 reps       PATIENT EDUCATION: Education details: HEP update/added back in L [employer]; habituation exercises and rationale for habituation treatment as well as importance of multi-sensory balance exercises to continue as part of HEP Person educated: Patient Education method: Explanation, Demonstration, Tactile cues, Verbal cues, and Handouts Education comprehension: verbalized understanding and returned demonstration          Note: Objective measures were completed at Evaluation unless otherwise noted.  DIAGNOSTIC FINDINGS: NA for this episode  COGNITION: Overall cognitive status: Within functional limits for tasks assessed   SENSATION: Not tested  POSTURE:  rounded shoulders, forward head, and head slightly tilted toward R  Cervical ROM:    Active A/PROM (deg) eval  Flexion 25  Extension 10  Right lateral flexion   Left lateral flexion   Right rotation WFL  Left rotation WFL  (Blank rows = not tested)   BED MOBILITY:  Independent, slowed and guarded  TRANSFERS: Assistive device utilized: None  Sit to stand: Modified independence Stand to sit: Modified independence  GAIT: Gait pattern: guarded, unsteady, holds to husband's arm and step through pattern Distance walked: 50 ft Assistive device utilized: holds to husband's arm Level of assistance: CGA Comments: Reports walking is better after seeing MD and him performing the Epley manuever  PATIENT SURVEYS:  DHI: THE DIZZINESS HANDICAP INVENTORY (DHI)  P1. Does looking up increase your problem? 0 = No  E2. Because of your problem, do you feel frustrated? 0 = No  F3. Because of your problem, do you  restrict your travel for business or recreation?  2 = Sometimes  P4. Does walking down the aisle of a supermarket increase your problems?  4 = Yes  F5. Because of your problem, do you have difficulty getting into or out of bed?  2 = Sometimes  F6. Does your problem significantly restrict your participation in social activities, such as going out to dinner, going to the movies, dancing, or going to parties? 2 = Sometimes  F7. Because of your problem, do you have difficulty reading?  0 = No  P8. Does performing  more ambitious activities such as sports, dancing, household chores (sweeping or putting dishes away) increase your problems?  4 = Yes  E9. Because of your problem, are you afraid to leave your home without having without having someone accompany you?  4 = Yes  E10. Because of your problem have you been embarrassed in front of others?  0 = No  P11. Do quick movements of your head increase your problem?  4 = Yes  F12. Because of your problem, do you avoid heights?  4 = Yes  P13. Does turning over in bed increase your problem?  4 = Yes  F14. Because of your problem, is it difficult for you to do strenuous homework or yard work? 4 = Yes  E15. Because of your problem, are you afraid people may think you are intoxicated? 0 = No  F16. Because of your problem, is it difficult for you to go for a walk by yourself?  4 = Yes  P17. Does walking down a sidewalk increase your problem?  0 = No  E18.Because of your problem, is it difficult for you to concentrate 4 = Yes  F19. Because of your problem, is it difficult for you to walk around your house in the dark? 0 = No  E20. Because of your problem, are you afraid to stay home alone?  0 = No  E21. Because of your problem, do you feel handicapped? 2 = Sometimes  E22. Has the problem placed stress on your relationships with members of your family or friends? 0 = No  E23. Because of your problem, are you depressed?  0 = No  F24. Does your problem interfere  with your job or household responsibilities?  0 = No  P25. Does bending over increase your problem?  4 = Yes  TOTAL 44    DHI Scoring Instructions  The patient is asked to answer each question as it pertains to dizziness or unsteadiness problems, specifically  considering their condition during the last month. Questions are designed to incorporate functional (F), physical  (P), and emotional (E) impacts on disability.   Scores greater than 10 points should be referred to balance specialists for further evaluation.   16-34 Points (mild handicap)  36-52 Points (moderate handicap)  54+ Points (severe handicap)  Minimally Detectable Change: 17 points ([address] [place], 1990)  [place], G. SHAUNNA. and [place], C. W. (1990). The development of the Dizziness Handicap Inventory. Archives of Otolaryngology - Head and Neck Surgery 116(4): [id number].   VESTIBULAR ASSESSMENT:  GENERAL OBSERVATION: No acute distress   SYMPTOM BEHAVIOR:  Subjective history: Pt reports waking up 9/17 and becoming instantly dizzy, very imbalanced trying to get to bathroom, sweaty and nauseated; with getting back to bed and sleeping, it subsided.  Got gradually slightly better, then worse over several days; went to Urgent Care and then to MD.  Reports rolling in bed brings it on.  Non-Vestibular symptoms: nausea/vomiting  Type of dizziness: Spinning/Vertigo and World moves  Frequency: been going on since 9/17  Duration: seconds  Aggravating factors: Induced by position change: rolling to the right, rolling to the left, and supine to sit and Induced by motion: looking up at the ceiling and turning head quickly  Relieving factors: head stationary, closing eyes, and slow movements  Progression of symptoms: better  OCULOMOTOR EXAM: Hx of ptosis, wears bifocals, reports eye muscle doesn't fully work on L? To see eye doctor 11/3  Ocular Alignment: normal  Ocular ROM: No Limitations  Spontaneous Nystagmus:  absent  Gaze-Induced Nystagmus: absent  Smooth Pursuits: intact  Saccades: intact and feels more restricted R>L  Convergence/Divergence: NT cm   Cover-cross-cover test: NT   VESTIBULAR - OCULAR REFLEX:   Slow VOR: Normal  VOR Cancellation: Comment: residual movement 4-5/10 symtpoms and settles  Head-Impulse Test: very guarded, unable to complete  Dynamic Visual Acuity: NT   POSITIONAL TESTING: Right Dix-Hallpike: no nystagmus and reports mild dizziness getting into this position for 10-20 seconds Left Dix-Hallpike: Performed x 1, no nystagmus, no dizziness; repeated with Loaded L DH, pt closes eyes due to dizziness going into position; no nystagmus noted by the time pt opened eyes.  Treated with Epley-see below Right Roll Test: no nystagmus Left Roll Test: no nystagmus  MOTION SENSITIVITY: NOT TESTED AT EVAL  Motion Sensitivity Quotient Intensity: 0 = none, 1 = Lightheaded, 2 = Mild, 3 = Moderate, 4 = Severe, 5 = Vomiting  Intensity  1. Sitting to supine   2. Supine to L side   3. Supine to R side   4. Supine to sitting   5. L Hallpike-Dix   6. Up from L    7. R Hallpike-Dix   8. Up from R    9. Sitting, head tipped to L knee   10. Head up from L knee   11. Sitting, head tipped to R knee   12. Head up from R knee   13. Sitting head turns x5   14.Sitting head nods x5   15. In stance, 180 turn to L    16. In stance, 180 turn to R                                                                                                                                TREATMENT DATE: 09/23/2024   Canalith Repositioning:  Epley Left: Number of Reps: 1 and Response to Treatment: comment: pt reports mild, brief (<5 sec) dizziness in position 2; then no dizziness position 3; she reports unsteady/dizziness upon sitting up EOM.   PATIENT EDUCATION: Education details: Eval results, POC , rationale for treatment. Discussed holding off on her exercises (Epley/Brandt/Daroff that were given  by MD) for now until we make sure we clear BPPV; discussed post-canal repositioning care Person educated: Patient and Spouse Education method: Explanation Education comprehension: verbalized understanding  HOME EXERCISE PROGRAM:  GOALS: Goals reviewed with patient? Yes  SHORT TERM GOALS: Target date: 10/02/2024  Pt will be independent with HEP for improved dizziness, balance. Baseline: Goal status: MET   LONG TERM GOALS: Target date: 11/05/2024  Pt will be independent with HEP for improved dizziness, balance. Baseline:  Goal status: IN progress   2.  DHI score to improve to 26, to demo decreased dizziness impacting ADLs. Baseline: 44; 44 10/01/24 Goal status: IN PROGRESS   3.  Pt will report 0/10 dizziness with bed mobility. Baseline: pt reports no dizziness this AM 10/01/24 Goal status: MET  4.  MCTSIB Condition 2 and 4 for full 3 seconds, mild-mod sway for improved balance. Baseline: 9 Sec, Moderate and Severe Sway; condition 4 11 sec 10/01/24 Goal status: IN PROGRESS   5.  Pt will improve DGI score to at least 20/24 to decrease fall risk. Baseline: 13/24; 18/24 10/01/24 Goal status: IN PROGRESS  ASSESSMENT:  CLINICAL IMPRESSION: Pt presents today with no new complaints; she does need to leave a bit early for dentis appointment, as she wants to get x-rays to make sure pain in tooth is not related to this dizziness issue. Skilled PT session focused on checking motion sensitivity and working on exercises to progress habituation.  She has most dizziness with return to supine from L and R rolling, L sidelying to sit, and nose to knee (also reports bending down to get things from floor and coming back up to stand always makes her dizzy/unsteady).  Updated HEP for rolling and L Wilhelmena Carrel (added back into HEP) and reviewed multi-sensory balance in corner.  She will continue to benefit from skilled PT towards goals for improved functional mobility and decreased fall risk.   OBJECTIVE IMPAIRMENTS: Abnormal gait, decreased balance, difficulty walking, and dizziness.   ACTIVITY LIMITATIONS: bending, bed mobility, and locomotion level  PARTICIPATION LIMITATIONS: meal prep, cleaning, laundry, community activity, yard work, and travel  PERSONAL FACTORS: 1-2 comorbidities: hx of dizziness x 10 years are also affecting patient's functional outcome.   REHAB POTENTIAL: Good  CLINICAL DECISION MAKING: Stable/uncomplicated  EVALUATION COMPLEXITY: Low   PLAN:  PT FREQUENCY: 1x/week  PT DURATION: 4 weeks   PLANNED INTERVENTIONS: 97750- Physical Performance Testing, 97110-Therapeutic exercises, 97530- Therapeutic activity, [id number]- Neuromuscular re-education, 97535- Self Care, [id number]- Manual therapy, [phone]- Gait training, [phone]- Canalith repositioning, Patient/Family education, Balance training, and Vestibular training  PLAN FOR NEXT SESSION: Additional habituation for picking up things from the ground and coming back up to stand, progression of corner balance.  HEP review and update with current DHI complaints- looking up, rolling in bed, and address balance with head turns/nods, body turns     Greig Anon, PT 10/07/24 11:45 AM Phone: [phone] Fax: [phone]  [facility] at [facility] [address], [address] [place], [place] [address] Phone # [phone] Fax # [phone]

## 2024-10-13 NOTE — Therapy (Signed)
 OUTPATIENT PHYSICAL THERAPY VESTIBULAR TREATMENT NOTE     Patient Name: Summer Wood MRN: [id number] DOB:04-Apr-1941, 83 y.o., female Today's Date: 10/14/2024  END OF SESSION:  PT End of Session - 10/14/24 0927     Visit Number 6    Number of Visits 8    Date for Recertification  11/05/24    Authorization Type [employer]-auth submitted    Authorization Time Period 12 visits 09/23/24-10/27/24    Authorization - Visit Number 6    Authorization - Number of Visits 12    PT Start Time 0849    PT Stop Time 0929    PT Time Calculation (min) 40 min    Equipment Utilized During Treatment Gait belt    Activity Tolerance Patient tolerated treatment well    Behavior During Therapy [facility] for tasks assessed/performed              History reviewed. No pertinent past medical history. Past Surgical History:  Procedure Laterality Date   CATARACT EXTRACTION     eye lift     Patient Active Problem List   Diagnosis Date Noted   Acute gastroenteritis 10/05/2022    PCP: Pinal Planas, MD REFERRING PROVIDER: Dayna Motto, DO   REFERRING DIAG: [phone] (ICD-10-CM) - Benign paroxysmal vertigo, left ear   THERAPY DIAG:  BPPV (benign paroxysmal positional vertigo), left  Dizziness and giddiness  Unsteadiness on feet  ONSET DATE: 09/07/2024 (MD referral)  Rationale for Evaluation and Treatment: Rehabilitation  SUBJECTIVE:   SUBJECTIVE STATEMENT: I've gotten rid of the fullness in my head. Had a big yawn which released the pressure.    Pt accompanied by: self  PERTINENT HISTORY: Hx of dizziness since 2013, worsening in past 6 weeks (saw MD with c/o 09/01/24 and 09/07/24)  PAIN:  Are you having pain? No  PRECAUTIONS: Fall  RED FLAGS: None   WEIGHT BEARING RESTRICTIONS: No  FALLS: Has patient fallen in last 6 months? No  LIVING ENVIRONMENT: Lives with: lives with their spouse Lives in: House/apartment  PLOF: Independent  PATIENT GOALS: To fully get rid of  dizziness  OBJECTIVE:      TODAY'S TREATMENT: 10/14/24 Activity Comments  standing D2 flexion to cone on floor 2x5 Slow, ankle instability; CGA for safety. 2nd set performed at quicker pace. C/o mild dizziness in L upper diagonal. Instructed pt on performing this in sitting instead of standing for HEP.  bending to place cone on floor, fwd step over it CGA; balance checks occasionally more when leading w/ the L LE  fwd/back stepping, then added with head nods Cueing for appropriate sized steps; cueing to coordinate head movement with steps. C/o light dizziness and requires 1 UE support   1/2 turns to targets in II bars , then added toe tap C/o more dizziness with L turns and c/o more dizziness after adding cone tap          HOME EXERCISE PROGRAM Access Code: [email or web address] URL: [email or web address] Date: 10/14/2024 Prepared by: [facility] - Outpatient  Rehab - [facility]  Program Notes perform with husband present for safety.  Exercises - Corner Balance Feet Together With Eyes Open  - 1 x daily - 7 x weekly - 3 sets - 30 sec hold - Corner Balance Feet Together With Eyes Closed  - 1 x daily - 7 x weekly - 3 sets - 30 sec hold - Corner Balance Feet Together: Eyes Open With Head Turns  - 1 x daily - 7 x weekly - 3  sets - 3 reps - Corner Balance Feet Together: Eyes Closed With Head Turns  - 1 x daily - 7 x weekly - 3 sets - 3 reps - Walking Backward with Head Rotation  - 1 x daily - 7 x weekly - 1-3 sets - 1-2 min rounds hold - Brandt-Daroff Vestibular Exercise  - 1-2 x daily - 7 x weekly - 1 sets - 3 reps - Standing Diagonal Chops with Medicine Ball  - 1 x daily - 5 x weekly - 2 sets - 10 reps - Alternating Step Forward with Support  - 1 x daily - 5 x weekly - 2 sets - 10 reps    PATIENT EDUCATION: Education details: HEP with edu for safety, HEP consolidation  Person educated: Patient Education method: Explanation, Demonstration, Tactile cues, Verbal cues, and  Handouts Education comprehension: verbalized understanding and returned demonstration     Note: Objective measures were completed at Evaluation unless otherwise noted.  DIAGNOSTIC FINDINGS: NA for this episode  COGNITION: Overall cognitive status: Within functional limits for tasks assessed   SENSATION: Not tested  POSTURE:  rounded shoulders, forward head, and head slightly tilted toward R  Cervical ROM:    Active A/PROM (deg) eval  Flexion 25  Extension 10  Right lateral flexion   Left lateral flexion   Right rotation WFL  Left rotation WFL  (Blank rows = not tested)   BED MOBILITY:  Independent, slowed and guarded  TRANSFERS: Assistive device utilized: None  Sit to stand: Modified independence Stand to sit: Modified independence  GAIT: Gait pattern: guarded, unsteady, holds to husband's arm and step through pattern Distance walked: 50 ft Assistive device utilized: holds to husband's arm Level of assistance: CGA Comments: Reports walking is better after seeing MD and him performing the Epley manuever  PATIENT SURVEYS:  DHI: THE DIZZINESS HANDICAP INVENTORY (DHI)  P1. Does looking up increase your problem? 0 = No  E2. Because of your problem, do you feel frustrated? 0 = No  F3. Because of your problem, do you restrict your travel for business or recreation?  2 = Sometimes  P4. Does walking down the aisle of a supermarket increase your problems?  4 = Yes  F5. Because of your problem, do you have difficulty getting into or out of bed?  2 = Sometimes  F6. Does your problem significantly restrict your participation in social activities, such as going out to dinner, going to the movies, dancing, or going to parties? 2 = Sometimes  F7. Because of your problem, do you have difficulty reading?  0 = No  P8. Does performing more ambitious activities such as sports, dancing, household chores (sweeping or putting dishes away) increase your problems?  4 = Yes  E9.  Because of your problem, are you afraid to leave your home without having without having someone accompany you?  4 = Yes  E10. Because of your problem have you been embarrassed in front of others?  0 = No  P11. Do quick movements of your head increase your problem?  4 = Yes  F12. Because of your problem, do you avoid heights?  4 = Yes  P13. Does turning over in bed increase your problem?  4 = Yes  F14. Because of your problem, is it difficult for you to do strenuous homework or yard work? 4 = Yes  E15. Because of your problem, are you afraid people may think you are intoxicated? 0 = No  F16. Because of your problem,  is it difficult for you to go for a walk by yourself?  4 = Yes  P17. Does walking down a sidewalk increase your problem?  0 = No  E18.Because of your problem, is it difficult for you to concentrate 4 = Yes  F19. Because of your problem, is it difficult for you to walk around your house in the dark? 0 = No  E20. Because of your problem, are you afraid to stay home alone?  0 = No  E21. Because of your problem, do you feel handicapped? 2 = Sometimes  E22. Has the problem placed stress on your relationships with members of your family or friends? 0 = No  E23. Because of your problem, are you depressed?  0 = No  F24. Does your problem interfere with your job or household responsibilities?  0 = No  P25. Does bending over increase your problem?  4 = Yes  TOTAL 44    DHI Scoring Instructions  The patient is asked to answer each question as it pertains to dizziness or unsteadiness problems, specifically  considering their condition during the last month. Questions are designed to incorporate functional (F), physical  (P), and emotional (E) impacts on disability.   Scores greater than 10 points should be referred to balance specialists for further evaluation.   16-34 Points (mild handicap)  36-52 Points (moderate handicap)  54+ Points (severe handicap)  Minimally Detectable Change:  17 points ([address] [place], 1990)  Wyn[place]ham, G. SHAUNNA. and [place], C. W. (1990). The development of the Dizziness Handicap Inventory. Archives of Otolaryngology - Head and Neck Surgery 116(4): [id number].   VESTIBULAR ASSESSMENT:  GENERAL OBSERVATION: No acute distress   SYMPTOM BEHAVIOR:  Subjective history: Pt reports waking up 9/17 and becoming instantly dizzy, very imbalanced trying to get to bathroom, sweaty and nauseated; with getting back to bed and sleeping, it subsided.  Got gradually slightly better, then worse over several days; went to Urgent Care and then to MD.  Reports rolling in bed brings it on.  Non-Vestibular symptoms: nausea/vomiting  Type of dizziness: Spinning/Vertigo and World moves  Frequency: been going on since 9/17  Duration: seconds  Aggravating factors: Induced by position change: rolling to the right, rolling to the left, and supine to sit and Induced by motion: looking up at the ceiling and turning head quickly  Relieving factors: head stationary, closing eyes, and slow movements  Progression of symptoms: better  OCULOMOTOR EXAM: Hx of ptosis, wears bifocals, reports eye muscle doesn't fully work on L? To see eye doctor 11/3  Ocular Alignment: normal  Ocular ROM: No Limitations  Spontaneous Nystagmus: absent  Gaze-Induced Nystagmus: absent  Smooth Pursuits: intact  Saccades: intact and feels more restricted R>L  Convergence/Divergence: NT cm   Cover-cross-cover test: NT   VESTIBULAR - OCULAR REFLEX:   Slow VOR: Normal  VOR Cancellation: Comment: residual movement 4-5/10 symtpoms and settles  Head-Impulse Test: very guarded, unable to complete  Dynamic Visual Acuity: NT   POSITIONAL TESTING: Right Dix-Hallpike: no nystagmus and reports mild dizziness getting into this position for 10-20 seconds Left Dix-Hallpike: Performed x 1, no nystagmus, no dizziness; repeated with Loaded L DH, pt closes eyes due to dizziness going into position; no nystagmus  noted by the time pt opened eyes.  Treated with Epley-see below Right Roll Test: no nystagmus Left Roll Test: no nystagmus  MOTION SENSITIVITY: NOT TESTED AT EVAL  Motion Sensitivity Quotient Intensity: 0 = none, 1 = Lightheaded, 2 = Mild, 3 =  Moderate, 4 = Severe, 5 = Vomiting  Intensity  1. Sitting to supine   2. Supine to L side   3. Supine to R side   4. Supine to sitting   5. L Hallpike-Dix   6. Up from L    7. R Hallpike-Dix   8. Up from R    9. Sitting, head tipped to L knee   10. Head up from L knee   11. Sitting, head tipped to R knee   12. Head up from R knee   13. Sitting head turns x5   14.Sitting head nods x5   15. In stance, 180 turn to L    16. In stance, 180 turn to R                                                                                                                                TREATMENT DATE: 09/23/2024   Canalith Repositioning:  Epley Left: Number of Reps: 1 and Response to Treatment: comment: pt reports mild, brief (<5 sec) dizziness in position 2; then no dizziness position 3; she reports unsteady/dizziness upon sitting up EOM.   PATIENT EDUCATION: Education details: Eval results, POC , rationale for treatment. Discussed holding off on her exercises (Epley/Brandt/Daroff that were given by MD) for now until we make sure we clear BPPV; discussed post-canal repositioning care Person educated: Patient and Spouse Education method: Explanation Education comprehension: verbalized understanding  HOME EXERCISE PROGRAM:  GOALS: Goals reviewed with patient? Yes  SHORT TERM GOALS: Target date: 10/02/2024  Pt will be independent with HEP for improved dizziness, balance. Baseline: Goal status: MET   LONG TERM GOALS: Target date: 11/05/2024  Pt will be independent with HEP for improved dizziness, balance. Baseline:  Goal status: IN progress   2.  DHI score to improve to 26, to demo decreased dizziness impacting ADLs. Baseline: 44; 44  10/01/24 Goal status: IN PROGRESS   3.  Pt will report 0/10 dizziness with bed mobility. Baseline: pt reports no dizziness this AM 10/01/24 Goal status: MET  4.  MCTSIB Condition 2 and 4 for full 3 seconds, mild-mod sway for improved balance. Baseline: 9 Sec, Moderate and Severe Sway; condition 4 11 sec 10/01/24 Goal status: IN PROGRESS   5.  Pt will improve DGI score to at least 20/24 to decrease fall risk. Baseline: 13/24; 18/24 10/01/24 Goal status: IN PROGRESS  ASSESSMENT:  CLINICAL IMPRESSION: Patient arrived to session with report of resolution of head pressure from an episode of yawning. Session focused on habituating bending , turns, and head movements. Patient noted dizziness worst to L direction today. Imbalance evident with SLS and activities and required CGA or UE support for safety. Patient tolerated session well and without complains upon leaving.   OBJECTIVE IMPAIRMENTS: Abnormal gait, decreased balance, difficulty walking, and dizziness.   ACTIVITY LIMITATIONS: bending, bed mobility, and locomotion level  PARTICIPATION LIMITATIONS: meal prep, cleaning, laundry, community activity, yard  work, and travel  PERSONAL FACTORS: 1-2 comorbidities: hx of dizziness x 10 years are also affecting patient's functional outcome.   REHAB POTENTIAL: Good  CLINICAL DECISION MAKING: Stable/uncomplicated  EVALUATION COMPLEXITY: Low   PLAN:  PT FREQUENCY: 1x/week  PT DURATION: 4 weeks   PLANNED INTERVENTIONS: 97750- Physical Performance Testing, 97110-Therapeutic exercises, 97530- Therapeutic activity, [id number]- Neuromuscular re-education, 97535- Self Care, [id number]- Manual therapy, [phone]- Gait training, [phone]- Canalith repositioning, Patient/Family education, Balance training, and Vestibular training  PLAN FOR NEXT SESSION: Additional habituation for picking up things from the ground and coming back up to stand, progression of corner balance.  HEP review and update with current DHI  complaints- looking up, rolling in bed, and address balance with head turns/nods, body turns     Louana Terrilyn Christians, [place], DPT 10/14/24 9:30 AM  [facility] at [facility] [address] [place], [place] [address] Phone # [phone] Fax # [phone]

## 2024-10-14 ENCOUNTER — Encounter: Payer: Self-pay | Admitting: Physical Therapy

## 2024-10-14 ENCOUNTER — Ambulatory Visit: Admitting: Physical Therapy

## 2024-10-14 DIAGNOSIS — H8112 Benign paroxysmal vertigo, left ear: Secondary | ICD-10-CM

## 2024-10-14 DIAGNOSIS — R2681 Unsteadiness on feet: Secondary | ICD-10-CM | POA: Diagnosis not present

## 2024-10-14 DIAGNOSIS — R42 Dizziness and giddiness: Secondary | ICD-10-CM | POA: Diagnosis not present

## 2024-10-20 DIAGNOSIS — X32XXXD Exposure to sunlight, subsequent encounter: Secondary | ICD-10-CM | POA: Diagnosis not present

## 2024-10-20 DIAGNOSIS — L570 Actinic keratosis: Secondary | ICD-10-CM | POA: Diagnosis not present

## 2024-10-22 ENCOUNTER — Ambulatory Visit: Admitting: Physical Therapy

## 2024-10-22 ENCOUNTER — Encounter: Payer: Self-pay | Admitting: Physical Therapy

## 2024-10-22 DIAGNOSIS — R42 Dizziness and giddiness: Secondary | ICD-10-CM

## 2024-10-22 DIAGNOSIS — H8112 Benign paroxysmal vertigo, left ear: Secondary | ICD-10-CM

## 2024-10-22 DIAGNOSIS — R2681 Unsteadiness on feet: Secondary | ICD-10-CM | POA: Diagnosis not present

## 2024-10-22 NOTE — Therapy (Signed)
 OUTPATIENT PHYSICAL THERAPY VESTIBULAR TREATMENT NOTE     Patient Name: Summer Wood MRN: [id number] DOB:1940-12-24, 83 y.o., female Today's Date: 10/22/2024  END OF SESSION:  PT End of Session - 10/22/24 1021     Visit Number 7    Number of Visits 8    Date for Recertification  11/05/24    Authorization Type [employer]-auth submitted    Authorization Time Period 12 visits 09/23/24-10/27/24    Authorization - Visit Number 7    Authorization - Number of Visits 12    PT Start Time 1022    PT Stop Time 1100    PT Time Calculation (min) 38 min    Equipment Utilized During Treatment Gait belt    Activity Tolerance Patient tolerated treatment well    Behavior During Therapy WFL for tasks assessed/performed               History reviewed. No pertinent past medical history. Past Surgical History:  Procedure Laterality Date   CATARACT EXTRACTION     eye lift     Patient Active Problem List   Diagnosis Date Noted   Acute gastroenteritis 10/05/2022    PCP: Pinal Planas, MD REFERRING PROVIDER: Dayna Motto, DO   REFERRING DIAG: [phone] (ICD-10-CM) - Benign paroxysmal vertigo, left ear   THERAPY DIAG:  BPPV (benign paroxysmal positional vertigo), left  Dizziness and giddiness  Unsteadiness on feet  ONSET DATE: 09/07/2024 (MD referral)  Rationale for Evaluation and Treatment: Rehabilitation  SUBJECTIVE:   SUBJECTIVE STATEMENT: Feeling better overall.  No spinning sensations anymore.     Pt accompanied by: self  PERTINENT HISTORY: Hx of dizziness since 2013, worsening in past 6 weeks (saw MD with c/o 09/01/24 and 09/07/24)  PAIN:  Are you having pain? No  PRECAUTIONS: Fall  RED FLAGS: None   WEIGHT BEARING RESTRICTIONS: No  FALLS: Has patient fallen in last 6 months? No  LIVING ENVIRONMENT: Lives with: lives with their spouse Lives in: House/apartment  PLOF: Independent  PATIENT GOALS: To fully get rid of dizziness  OBJECTIVE:    Feel normal in the mornings and worse as the day goes on (in regards to overall dizziness)  TODAY'S TREATMENT: 10/22/2024 Activity Comments  Reviewed diagonal reaching and forward/back stepping as HEP Minor cues for technique  Bend down to pick up/put down cones, then step over, then turn x 6 reps Reports mild dizziness with turns added  Step taps to targets, then added 1/2 turns Hovers hand at counter and has increased unsteadiness  In doorway, 180 degree turns R and L, 2 sets x 3 reps Mild unsteadiness to L  Standing balance at counter: Feet together EC Partial tandem EO in corner Tandem gait    Cues for looking ahead  Fall prevention education Discussed POC and plans for possible d/c next visit    Pt reports mild dizziness upon end of the session. (Consistent with how she feels at the end of the day at home)     HOME EXERCISE PROGRAM Access Code: [email or web address] URL: [email or web address] Date: 10/22/2024 Prepared by: [facility] - [facility]  Program Notes perform with husband present for safety.  Exercises - Corner Balance Feet Together With Eyes Open  - 1 x daily - 7 x weekly - 3 sets - 30 sec hold - Corner Balance Feet Together With Eyes Closed  - 1 x daily - 7 x weekly - 3 sets - 30 sec hold - Corner Balance Feet Together: Eyes  Open With Head Turns  - 1 x daily - 7 x weekly - 3 sets - 3 reps - Corner Balance Feet Together: Eyes Closed With Head Turns  - 1 x daily - 7 x weekly - 3 sets - 3 reps - Walking Backward with Head Rotation  - 1 x daily - 7 x weekly - 1-3 sets - 1-2 min rounds hold - Brandt-Daroff Vestibular Exercise  - 1-2 x daily - 7 x weekly - 1 sets - 3 reps - Standing Diagonal Chops with Medicine Ball  - 1 x daily - 5 x weekly - 2 sets - 10 reps - Alternating Step Forward with Support  - 1 x daily - 5 x weekly - 2 sets - 10 reps - 180 Degree Pivot Turn with Single Point Cane  - 1-2 x daily - 7 x weekly - 3 sets - 3  reps   PATIENT EDUCATION: Education details: HEP with edu for safety, HEP update to include turns  Person educated: Patient Education method: Explanation, Demonstration, Tactile cues, Verbal cues, and Handouts Education comprehension: verbalized understanding and returned demonstration     Note: Objective measures were completed at Evaluation unless otherwise noted.  DIAGNOSTIC FINDINGS: NA for this episode  COGNITION: Overall cognitive status: Within functional limits for tasks assessed   SENSATION: Not tested  POSTURE:  rounded shoulders, forward head, and head slightly tilted toward R  Cervical ROM:    Active A/PROM (deg) eval  Flexion 25  Extension 10  Right lateral flexion   Left lateral flexion   Right rotation WFL  Left rotation WFL  (Blank rows = not tested)   BED MOBILITY:  Independent, slowed and guarded  TRANSFERS: Assistive device utilized: None  Sit to stand: Modified independence Stand to sit: Modified independence  GAIT: Gait pattern: guarded, unsteady, holds to husband's arm and step through pattern Distance walked: 50 ft Assistive device utilized: holds to husband's arm Level of assistance: CGA Comments: Reports walking is better after seeing MD and him performing the Epley manuever  PATIENT SURVEYS:  DHI: THE DIZZINESS HANDICAP INVENTORY (DHI)  P1. Does looking up increase your problem? 0 = No  E2. Because of your problem, do you feel frustrated? 0 = No  F3. Because of your problem, do you restrict your travel for business or recreation?  2 = Sometimes  P4. Does walking down the aisle of a supermarket increase your problems?  4 = Yes  F5. Because of your problem, do you have difficulty getting into or out of bed?  2 = Sometimes  F6. Does your problem significantly restrict your participation in social activities, such as going out to dinner, going to the movies, dancing, or going to parties? 2 = Sometimes  F7. Because of your problem, do  you have difficulty reading?  0 = No  P8. Does performing more ambitious activities such as sports, dancing, household chores (sweeping or putting dishes away) increase your problems?  4 = Yes  E9. Because of your problem, are you afraid to leave your home without having without having someone accompany you?  4 = Yes  E10. Because of your problem have you been embarrassed in front of others?  0 = No  P11. Do quick movements of your head increase your problem?  4 = Yes  F12. Because of your problem, do you avoid heights?  4 = Yes  P13. Does turning over in bed increase your problem?  4 = Yes  F14. Because of  your problem, is it difficult for you to do strenuous homework or yard work? 4 = Yes  E15. Because of your problem, are you afraid people may think you are intoxicated? 0 = No  F16. Because of your problem, is it difficult for you to go for a walk by yourself?  4 = Yes  P17. Does walking down a sidewalk increase your problem?  0 = No  E18.Because of your problem, is it difficult for you to concentrate 4 = Yes  F19. Because of your problem, is it difficult for you to walk around your house in the dark? 0 = No  E20. Because of your problem, are you afraid to stay home alone?  0 = No  E21. Because of your problem, do you feel handicapped? 2 = Sometimes  E22. Has the problem placed stress on your relationships with members of your family or friends? 0 = No  E23. Because of your problem, are you depressed?  0 = No  F24. Does your problem interfere with your job or household responsibilities?  0 = No  P25. Does bending over increase your problem?  4 = Yes  TOTAL 44    DHI Scoring Instructions  The patient is asked to answer each question as it pertains to dizziness or unsteadiness problems, specifically  considering their condition during the last month. Questions are designed to incorporate functional (F), physical  (P), and emotional (E) impacts on disability.   Scores greater than 10  points should be referred to balance specialists for further evaluation.   16-34 Points (mild handicap)  36-52 Points (moderate handicap)  54+ Points (severe handicap)  Minimally Detectable Change: 17 points ([address] [place], 1990)  [place], G. SHAUNNA. and [place], C. W. (1990). The development of the Dizziness Handicap Inventory. Archives of Otolaryngology - Head and Neck Surgery 116(4): [id number].   VESTIBULAR ASSESSMENT:  GENERAL OBSERVATION: No acute distress   SYMPTOM BEHAVIOR:  Subjective history: Pt reports waking up 9/17 and becoming instantly dizzy, very imbalanced trying to get to bathroom, sweaty and nauseated; with getting back to bed and sleeping, it subsided.  Got gradually slightly better, then worse over several days; went to Urgent Care and then to MD.  Reports rolling in bed brings it on.  Non-Vestibular symptoms: nausea/vomiting  Type of dizziness: Spinning/Vertigo and World moves  Frequency: been going on since 9/17  Duration: seconds  Aggravating factors: Induced by position change: rolling to the right, rolling to the left, and supine to sit and Induced by motion: looking up at the ceiling and turning head quickly  Relieving factors: head stationary, closing eyes, and slow movements  Progression of symptoms: better  OCULOMOTOR EXAM: Hx of ptosis, wears bifocals, reports eye muscle doesn't fully work on L? To see eye doctor 11/3  Ocular Alignment: normal  Ocular ROM: No Limitations  Spontaneous Nystagmus: absent  Gaze-Induced Nystagmus: absent  Smooth Pursuits: intact  Saccades: intact and feels more restricted R>L  Convergence/Divergence: NT cm   Cover-cross-cover test: NT   VESTIBULAR - OCULAR REFLEX:   Slow VOR: Normal  VOR Cancellation: Comment: residual movement 4-5/10 symtpoms and settles  Head-Impulse Test: very guarded, unable to complete  Dynamic Visual Acuity: NT   POSITIONAL TESTING: Right Dix-Hallpike: no nystagmus and reports mild dizziness  getting into this position for 10-20 seconds Left Dix-Hallpike: Performed x 1, no nystagmus, no dizziness; repeated with Loaded L DH, pt closes eyes due to dizziness going into position; no nystagmus noted by the  time pt opened eyes.  Treated with Epley-see below Right Roll Test: no nystagmus Left Roll Test: no nystagmus  MOTION SENSITIVITY: NOT TESTED AT EVAL  Motion Sensitivity Quotient Intensity: 0 = none, 1 = Lightheaded, 2 = Mild, 3 = Moderate, 4 = Severe, 5 = Vomiting  Intensity  1. Sitting to supine   2. Supine to L side   3. Supine to R side   4. Supine to sitting   5. L Hallpike-Dix   6. Up from L    7. R Hallpike-Dix   8. Up from R    9. Sitting, head tipped to L knee   10. Head up from L knee   11. Sitting, head tipped to R knee   12. Head up from R knee   13. Sitting head turns x5   14.Sitting head nods x5   15. In stance, 180 turn to L    16. In stance, 180 turn to R                                                                                                                                TREATMENT DATE: 09/23/2024   Canalith Repositioning:  Epley Left: Number of Reps: 1 and Response to Treatment: comment: pt reports mild, brief (<5 sec) dizziness in position 2; then no dizziness position 3; she reports unsteady/dizziness upon sitting up EOM.   PATIENT EDUCATION: Education details: Eval results, POC , rationale for treatment. Discussed holding off on her exercises (Epley/Brandt/Daroff that were given by MD) for now until we make sure we clear BPPV; discussed post-canal repositioning care Person educated: Patient and Spouse Education method: Explanation Education comprehension: verbalized understanding  HOME EXERCISE PROGRAM:  GOALS: Goals reviewed with patient? Yes  SHORT TERM GOALS: Target date: 10/02/2024  Pt will be independent with HEP for improved dizziness, balance. Baseline: Goal status: MET   LONG TERM GOALS: Target date: 11/05/2024  Pt  will be independent with HEP for improved dizziness, balance. Baseline:  Goal status: IN progress   2.  DHI score to improve to 26, to demo decreased dizziness impacting ADLs. Baseline: 44; 44 10/01/24 Goal status: IN PROGRESS   3.  Pt will report 0/10 dizziness with bed mobility. Baseline: pt reports no dizziness this AM 10/01/24 Goal status: MET  4.  MCTSIB Condition 2 and 4 for full 3 seconds, mild-mod sway for improved balance. Baseline: 9 Sec, Moderate and Severe Sway; condition 4 11 sec 10/01/24 Goal status: IN PROGRESS   5.  Pt will improve DGI score to at least 20/24 to decrease fall risk. Baseline: 13/24; 18/24 10/01/24 Goal status: IN PROGRESS  ASSESSMENT:  CLINICAL IMPRESSION: Pt presents today with no new complaints; overall feeling improvement in dizziness.  She does note again longstanding hx of dizziness and motion sickness; she does report increased dizziness at the end of the day, but that is not new and is not the spinning sensation (which  pt is no longer having). Skilled PT session focused on review of and progression of balance exercises and habituation.  Most noteable issues with turns to L and bending down to pick up cones combined with turns. She is reporting about 90% back to her baseline and may be ready for discharge soon.  Pt will continue to benefit from skilled PT towards goals for improved functional mobility and decreased fall risk.   OBJECTIVE IMPAIRMENTS: Abnormal gait, decreased balance, difficulty walking, and dizziness.   ACTIVITY LIMITATIONS: bending, bed mobility, and locomotion level  PARTICIPATION LIMITATIONS: meal prep, cleaning, laundry, community activity, yard work, and travel  PERSONAL FACTORS: 1-2 comorbidities: hx of dizziness x 10 years are also affecting patient's functional outcome.   REHAB POTENTIAL: Good  CLINICAL DECISION MAKING: Stable/uncomplicated  EVALUATION COMPLEXITY: Low   PLAN:  PT FREQUENCY: 1x/week  PT  DURATION: 4 weeks   PLANNED INTERVENTIONS: 97750- Physical Performance Testing, 97110-Therapeutic exercises, 97530- Therapeutic activity, 97112- Neuromuscular re-education, 97535- Self Care, 02859- Manual therapy, [phone]- Gait training, [phone]- Canalith repositioning, Patient/Family education, Balance training, and Vestibular training  PLAN FOR NEXT SESSION: Check LTGs and discuss POC; likely plans for discharge     Greig Anon, PT 10/22/24 3:14 PM Phone: [phone] Fax: [phone]  [facility] at [facility] [address] [place], [place] [address] Phone # [phone] Fax # [phone]

## 2024-10-26 NOTE — Therapy (Signed)
 OUTPATIENT PHYSICAL THERAPY VESTIBULAR TREATMENT NOTE     Patient Name: Summer Wood MRN: [id number] DOB:1941-10-23, 83 y.o., female Today's Date: 10/26/2024  END OF SESSION:         No past medical history on file. Past Surgical History:  Procedure Laterality Date   CATARACT EXTRACTION     eye lift     Patient Active Problem List   Diagnosis Date Noted   Acute gastroenteritis 10/05/2022    PCP: Pinal Planas, MD REFERRING PROVIDER: Dayna Motto, DO   REFERRING DIAG: [phone] (ICD-10-CM) - Benign paroxysmal vertigo, left ear   THERAPY DIAG:  No diagnosis found.  ONSET DATE: 09/07/2024 (MD referral)  Rationale for Evaluation and Treatment: Rehabilitation  SUBJECTIVE:   SUBJECTIVE STATEMENT: Feeling better overall.  No spinning sensations anymore.     Pt accompanied by: self  PERTINENT HISTORY: Hx of dizziness since 2013, worsening in past 6 weeks (saw MD with c/o 09/01/24 and 09/07/24)  PAIN:  Are you having pain? No  PRECAUTIONS: Fall  RED FLAGS: None   WEIGHT BEARING RESTRICTIONS: No  FALLS: Has patient fallen in last 6 months? No  LIVING ENVIRONMENT: Lives with: lives with their spouse Lives in: House/apartment  PLOF: Independent  PATIENT GOALS: To fully get rid of dizziness  OBJECTIVE:     TODAY'S TREATMENT: 10/27/24 Activity Comments                          Feel normal in the mornings and worse as the day goes on (in regards to overall dizziness)  TODAY'S TREATMENT: 10/22/2024 Activity Comments  Reviewed diagonal reaching and forward/back stepping as HEP Minor cues for technique  Bend down to pick up/put down cones, then step over, then turn x 6 reps Reports mild dizziness with turns added  Step taps to targets, then added 1/2 turns Hovers hand at counter and has increased unsteadiness  In doorway, 180 degree turns R and L, 2 sets x 3 reps Mild unsteadiness to L  Standing balance at counter: Feet together  EC Partial tandem EO in corner Tandem gait    Cues for looking ahead  Fall prevention education Discussed POC and plans for possible d/c next visit    Pt reports mild dizziness upon end of the session. (Consistent with how she feels at the end of the day at home)     HOME EXERCISE PROGRAM Access Code: [email or web address] URL: [email or web address] Date: 10/22/2024 Prepared by: [facility] - Outpatient  Rehab - [facility]  Program Notes perform with husband present for safety.  Exercises - Corner Balance Feet Together With Eyes Open  - 1 x daily - 7 x weekly - 3 sets - 30 sec hold - Corner Balance Feet Together With Eyes Closed  - 1 x daily - 7 x weekly - 3 sets - 30 sec hold - Corner Balance Feet Together: Eyes Open With Head Turns  - 1 x daily - 7 x weekly - 3 sets - 3 reps - Corner Balance Feet Together: Eyes Closed With Head Turns  - 1 x daily - 7 x weekly - 3 sets - 3 reps - Walking Backward with Head Rotation  - 1 x daily - 7 x weekly - 1-3 sets - 1-2 min rounds hold - Brandt-Daroff Vestibular Exercise  - 1-2 x daily - 7 x weekly - 1 sets - 3 reps - Standing Diagonal Chops with Medicine Ball  - 1 x daily - 5 x  weekly - 2 sets - 10 reps - Alternating Step Forward with Support  - 1 x daily - 5 x weekly - 2 sets - 10 reps - 180 Degree Pivot Turn with Single Point Cane  - 1-2 x daily - 7 x weekly - 3 sets - 3 reps   PATIENT EDUCATION: Education details: HEP with edu for safety, HEP update to include turns  Person educated: Patient Education method: Explanation, Demonstration, Tactile cues, Verbal cues, and Handouts Education comprehension: verbalized understanding and returned demonstration     Note: Objective measures were completed at Evaluation unless otherwise noted.  DIAGNOSTIC FINDINGS: NA for this episode  COGNITION: Overall cognitive status: Within functional limits for tasks assessed   SENSATION: Not tested  POSTURE:  rounded shoulders, forward  head, and head slightly tilted toward R  Cervical ROM:    Active A/PROM (deg) eval  Flexion 25  Extension 10  Right lateral flexion   Left lateral flexion   Right rotation WFL  Left rotation WFL  (Blank rows = not tested)   BED MOBILITY:  Independent, slowed and guarded  TRANSFERS: Assistive device utilized: None  Sit to stand: Modified independence Stand to sit: Modified independence  GAIT: Gait pattern: guarded, unsteady, holds to husband's arm and step through pattern Distance walked: 50 ft Assistive device utilized: holds to husband's arm Level of assistance: CGA Comments: Reports walking is better after seeing MD and him performing the Epley manuever  PATIENT SURVEYS:  DHI: THE DIZZINESS HANDICAP INVENTORY (DHI)  P1. Does looking up increase your problem? 0 = No  E2. Because of your problem, do you feel frustrated? 0 = No  F3. Because of your problem, do you restrict your travel for business or recreation?  2 = Sometimes  P4. Does walking down the aisle of a supermarket increase your problems?  4 = Yes  F5. Because of your problem, do you have difficulty getting into or out of bed?  2 = Sometimes  F6. Does your problem significantly restrict your participation in social activities, such as going out to dinner, going to the movies, dancing, or going to parties? 2 = Sometimes  F7. Because of your problem, do you have difficulty reading?  0 = No  P8. Does performing more ambitious activities such as sports, dancing, household chores (sweeping or putting dishes away) increase your problems?  4 = Yes  E9. Because of your problem, are you afraid to leave your home without having without having someone accompany you?  4 = Yes  E10. Because of your problem have you been embarrassed in front of others?  0 = No  P11. Do quick movements of your head increase your problem?  4 = Yes  F12. Because of your problem, do you avoid heights?  4 = Yes  P13. Does turning over in bed  increase your problem?  4 = Yes  F14. Because of your problem, is it difficult for you to do strenuous homework or yard work? 4 = Yes  E15. Because of your problem, are you afraid people may think you are intoxicated? 0 = No  F16. Because of your problem, is it difficult for you to go for a walk by yourself?  4 = Yes  P17. Does walking down a sidewalk increase your problem?  0 = No  E18.Because of your problem, is it difficult for you to concentrate 4 = Yes  F19. Because of your problem, is it difficult for you to walk around your  house in the dark? 0 = No  E20. Because of your problem, are you afraid to stay home alone?  0 = No  E21. Because of your problem, do you feel handicapped? 2 = Sometimes  E22. Has the problem placed stress on your relationships with members of your family or friends? 0 = No  E23. Because of your problem, are you depressed?  0 = No  F24. Does your problem interfere with your job or household responsibilities?  0 = No  P25. Does bending over increase your problem?  4 = Yes  TOTAL 44    DHI Scoring Instructions  The patient is asked to answer each question as it pertains to dizziness or unsteadiness problems, specifically  considering their condition during the last month. Questions are designed to incorporate functional (F), physical  (P), and emotional (E) impacts on disability.   Scores greater than 10 points should be referred to balance specialists for further evaluation.   16-34 Points (mild handicap)  36-52 Points (moderate handicap)  54+ Points (severe handicap)  Minimally Detectable Change: 17 points ([address] [place], 1990)  [place], G. SHAUNNA. and [place], C. W. (1990). The development of the Dizziness Handicap Inventory. Archives of Otolaryngology - Head and Neck Surgery 116(4): [id number].   VESTIBULAR ASSESSMENT:  GENERAL OBSERVATION: No acute distress   SYMPTOM BEHAVIOR:  Subjective history: Pt reports waking up 9/17 and becoming instantly dizzy,  very imbalanced trying to get to bathroom, sweaty and nauseated; with getting back to bed and sleeping, it subsided.  Got gradually slightly better, then worse over several days; went to Urgent Care and then to MD.  Reports rolling in bed brings it on.  Non-Vestibular symptoms: nausea/vomiting  Type of dizziness: Spinning/Vertigo and World moves  Frequency: been going on since 9/17  Duration: seconds  Aggravating factors: Induced by position change: rolling to the right, rolling to the left, and supine to sit and Induced by motion: looking up at the ceiling and turning head quickly  Relieving factors: head stationary, closing eyes, and slow movements  Progression of symptoms: better  OCULOMOTOR EXAM: Hx of ptosis, wears bifocals, reports eye muscle doesn't fully work on L? To see eye doctor 11/3  Ocular Alignment: normal  Ocular ROM: No Limitations  Spontaneous Nystagmus: absent  Gaze-Induced Nystagmus: absent  Smooth Pursuits: intact  Saccades: intact and feels more restricted R>L  Convergence/Divergence: NT cm   Cover-cross-cover test: NT   VESTIBULAR - OCULAR REFLEX:   Slow VOR: Normal  VOR Cancellation: Comment: residual movement 4-5/10 symtpoms and settles  Head-Impulse Test: very guarded, unable to complete  Dynamic Visual Acuity: NT   POSITIONAL TESTING: Right Dix-Hallpike: no nystagmus and reports mild dizziness getting into this position for 10-20 seconds Left Dix-Hallpike: Performed x 1, no nystagmus, no dizziness; repeated with Loaded L DH, pt closes eyes due to dizziness going into position; no nystagmus noted by the time pt opened eyes.  Treated with Epley-see below Right Roll Test: no nystagmus Left Roll Test: no nystagmus  MOTION SENSITIVITY: NOT TESTED AT EVAL  Motion Sensitivity Quotient Intensity: 0 = none, 1 = Lightheaded, 2 = Mild, 3 = Moderate, 4 = Severe, 5 = Vomiting  Intensity  1. Sitting to supine   2. Supine to L side   3. Supine to R side   4.  Supine to sitting   5. L Hallpike-Dix   6. Up from L    7. R Hallpike-Dix   8. Up from R  9. Sitting, head tipped to L knee   10. Head up from L knee   11. Sitting, head tipped to R knee   12. Head up from R knee   13. Sitting head turns x5   14.Sitting head nods x5   15. In stance, 180 turn to L    16. In stance, 180 turn to R                                                                                                                                TREATMENT DATE: 09/23/2024   Canalith Repositioning:  Epley Left: Number of Reps: 1 and Response to Treatment: comment: pt reports mild, brief (<5 sec) dizziness in position 2; then no dizziness position 3; she reports unsteady/dizziness upon sitting up EOM.   PATIENT EDUCATION: Education details: Eval results, POC , rationale for treatment. Discussed holding off on her exercises (Epley/Brandt/Daroff that were given by MD) for now until we make sure we clear BPPV; discussed post-canal repositioning care Person educated: Patient and Spouse Education method: Explanation Education comprehension: verbalized understanding  HOME EXERCISE PROGRAM:  GOALS: Goals reviewed with patient? Yes  SHORT TERM GOALS: Target date: 10/02/2024  Pt will be independent with HEP for improved dizziness, balance. Baseline: Goal status: MET   LONG TERM GOALS: Target date: 11/05/2024  Pt will be independent with HEP for improved dizziness, balance. Baseline:  Goal status: IN progress   2.  DHI score to improve to 26, to demo decreased dizziness impacting ADLs. Baseline: 44; 44 10/01/24 Goal status: IN PROGRESS   3.  Pt will report 0/10 dizziness with bed mobility. Baseline: pt reports no dizziness this AM 10/01/24 Goal status: MET  4.  MCTSIB Condition 2 and 4 for full 3 seconds, mild-mod sway for improved balance. Baseline: 9 Sec, Moderate and Severe Sway; condition 4 11 sec 10/01/24 Goal status: IN PROGRESS   5.  Pt will improve DGI  score to at least 20/24 to decrease fall risk. Baseline: 13/24; 18/24 10/01/24 Goal status: IN PROGRESS  ASSESSMENT:  CLINICAL IMPRESSION: Pt presents today with no new complaints; overall feeling improvement in dizziness.  She does note again longstanding hx of dizziness and motion sickness; she does report increased dizziness at the end of the day, but that is not new and is not the spinning sensation (which pt is no longer having). Skilled PT session focused on review of and progression of balance exercises and habituation.  Most noteable issues with turns to L and bending down to pick up cones combined with turns. She is reporting about 90% back to her baseline and may be ready for discharge soon.  Pt will continue to benefit from skilled PT towards goals for improved functional mobility and decreased fall risk.   OBJECTIVE IMPAIRMENTS: Abnormal gait, decreased balance, difficulty walking, and dizziness.   ACTIVITY LIMITATIONS: bending, bed mobility, and locomotion level  PARTICIPATION LIMITATIONS: meal prep, cleaning, laundry, community  activity, yard work, and travel  PERSONAL FACTORS: 1-2 comorbidities: hx of dizziness x 10 years are also affecting patient's functional outcome.   REHAB POTENTIAL: Good  CLINICAL DECISION MAKING: Stable/uncomplicated  EVALUATION COMPLEXITY: Low   PLAN:  PT FREQUENCY: 1x/week  PT DURATION: 4 weeks   PLANNED INTERVENTIONS: 97750- Physical Performance Testing, 97110-Therapeutic exercises, 97530- Therapeutic activity, [id number]- Neuromuscular re-education, 97535- Self Care, [id number]- Manual therapy, [phone]- Gait training, [phone]- Canalith repositioning, Patient/Family education, Balance training, and Vestibular training  PLAN FOR NEXT SESSION: Check LTGs and discuss POC; likely plans for discharge

## 2024-10-27 ENCOUNTER — Encounter: Payer: Self-pay | Admitting: Physical Therapy

## 2024-10-27 ENCOUNTER — Ambulatory Visit: Admitting: Physical Therapy

## 2024-10-27 DIAGNOSIS — H8112 Benign paroxysmal vertigo, left ear: Secondary | ICD-10-CM | POA: Diagnosis not present

## 2024-10-27 DIAGNOSIS — R42 Dizziness and giddiness: Secondary | ICD-10-CM | POA: Diagnosis not present

## 2024-10-27 DIAGNOSIS — R2681 Unsteadiness on feet: Secondary | ICD-10-CM

## 2024-11-02 DIAGNOSIS — Z961 Presence of intraocular lens: Secondary | ICD-10-CM | POA: Diagnosis not present

## 2024-11-02 DIAGNOSIS — H04123 Dry eye syndrome of bilateral lacrimal glands: Secondary | ICD-10-CM | POA: Diagnosis not present

## 2024-11-02 DIAGNOSIS — H02403 Unspecified ptosis of bilateral eyelids: Secondary | ICD-10-CM | POA: Diagnosis not present

## 2024-11-02 DIAGNOSIS — H5201 Hypermetropia, right eye: Secondary | ICD-10-CM | POA: Diagnosis not present

## 2024-11-02 DIAGNOSIS — H52223 Regular astigmatism, bilateral: Secondary | ICD-10-CM | POA: Diagnosis not present

## 2024-11-02 DIAGNOSIS — H5212 Myopia, left eye: Secondary | ICD-10-CM | POA: Diagnosis not present
